# Patient Record
Sex: Male | Born: 1970 | Race: Black or African American | Hispanic: No | Marital: Married | Smoking: Current every day smoker
Health system: Southern US, Community
[De-identification: ages and names within clinical notes are randomized; demographics above are authoritative.]

## PROBLEM LIST (undated history)

## (undated) DIAGNOSIS — R011 Cardiac murmur, unspecified: Secondary | ICD-10-CM

## (undated) DIAGNOSIS — K289 Gastrojejunal ulcer, unspecified as acute or chronic, without hemorrhage or perforation: Secondary | ICD-10-CM

## (undated) DIAGNOSIS — R7303 Prediabetes: Secondary | ICD-10-CM

## (undated) DIAGNOSIS — J45909 Unspecified asthma, uncomplicated: Secondary | ICD-10-CM

## (undated) DIAGNOSIS — M549 Dorsalgia, unspecified: Secondary | ICD-10-CM

## (undated) DIAGNOSIS — D171 Benign lipomatous neoplasm of skin and subcutaneous tissue of trunk: Secondary | ICD-10-CM

## (undated) HISTORY — PX: KNEE ARTHROSCOPY: SHX127

## (undated) HISTORY — DX: Cardiac murmur, unspecified: R01.1

## (undated) HISTORY — DX: Unspecified asthma, uncomplicated: J45.909

---

## 2016-04-21 ENCOUNTER — Encounter (HOSPITAL_COMMUNITY): Payer: Self-pay | Admitting: *Deleted

## 2016-04-21 ENCOUNTER — Emergency Department (HOSPITAL_COMMUNITY)
Admission: EM | Admit: 2016-04-21 | Discharge: 2016-04-21 | Disposition: A | Discharge status: Home / Self Care | Payer: No Typology Code available for payment source | Attending: Emergency Medicine | Admitting: Emergency Medicine

## 2016-04-21 ENCOUNTER — Emergency Department (HOSPITAL_COMMUNITY): Payer: No Typology Code available for payment source

## 2016-04-21 DIAGNOSIS — S39012A Strain of muscle, fascia and tendon of lower back, initial encounter: Secondary | ICD-10-CM | POA: Insufficient documentation

## 2016-04-21 DIAGNOSIS — S161XXA Strain of muscle, fascia and tendon at neck level, initial encounter: Secondary | ICD-10-CM | POA: Insufficient documentation

## 2016-04-21 DIAGNOSIS — S199XXA Unspecified injury of neck, initial encounter: Secondary | ICD-10-CM | POA: Diagnosis present

## 2016-04-21 DIAGNOSIS — Y92410 Unspecified street and highway as the place of occurrence of the external cause: Secondary | ICD-10-CM | POA: Diagnosis not present

## 2016-04-21 DIAGNOSIS — Y939 Activity, unspecified: Secondary | ICD-10-CM | POA: Insufficient documentation

## 2016-04-21 DIAGNOSIS — Y999 Unspecified external cause status: Secondary | ICD-10-CM | POA: Diagnosis not present

## 2016-04-21 DIAGNOSIS — F17200 Nicotine dependence, unspecified, uncomplicated: Secondary | ICD-10-CM | POA: Insufficient documentation

## 2016-04-21 DIAGNOSIS — E119 Type 2 diabetes mellitus without complications: Secondary | ICD-10-CM | POA: Insufficient documentation

## 2016-04-21 MED ORDER — TRAMADOL HCL 50 MG PO TABS: 50.0000 mg | ORAL_TABLET | Freq: Four times a day (QID) | ORAL | 0 refills | Status: DC | PRN

## 2016-04-21 MED ORDER — IBUPROFEN 800 MG PO TABS: 800.0000 mg | ORAL_TABLET | Freq: Three times a day (TID) | ORAL | 0 refills | Status: DC | PRN

## 2016-04-21 NOTE — Discharge Instructions (Signed)
RETURN here as needed. Ice and heat on the areas that are sore.

## 2016-04-21 NOTE — ED Notes (Signed)
Pt is in stable condition d/c and ambulates from ED.
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
## 2016-04-22 ENCOUNTER — Emergency Department (HOSPITAL_COMMUNITY)
Admission: EM | Admit: 2016-04-22 | Discharge: 2016-04-22 | Disposition: A | Discharge status: Home / Self Care | Payer: No Typology Code available for payment source | Attending: Emergency Medicine | Admitting: Emergency Medicine

## 2016-04-22 ENCOUNTER — Encounter (HOSPITAL_COMMUNITY): Payer: Self-pay | Admitting: Emergency Medicine

## 2016-04-22 DIAGNOSIS — F17200 Nicotine dependence, unspecified, uncomplicated: Secondary | ICD-10-CM | POA: Insufficient documentation

## 2016-04-22 DIAGNOSIS — S4992XD Unspecified injury of left shoulder and upper arm, subsequent encounter: Secondary | ICD-10-CM | POA: Diagnosis present

## 2016-04-22 DIAGNOSIS — M25512 Pain in left shoulder: Secondary | ICD-10-CM

## 2016-04-22 DIAGNOSIS — E119 Type 2 diabetes mellitus without complications: Secondary | ICD-10-CM | POA: Diagnosis not present

## 2016-04-22 DIAGNOSIS — M542 Cervicalgia: Secondary | ICD-10-CM | POA: Insufficient documentation

## 2016-04-22 MED ORDER — CYCLOBENZAPRINE HCL 10 MG PO TABS
10.0000 mg | ORAL_TABLET | Freq: Once | ORAL | Status: AC
  Administered 2016-04-22: 10 mg via ORAL
  Filled 2016-04-22: qty 1

## 2016-04-22 MED ORDER — LIDO-CAPSAICIN-MEN-METHYL SAL 0.5-0.035-5-20 % EX PTCH: 1.0000 | MEDICATED_PATCH | Freq: Every day | CUTANEOUS | 0 refills | Status: DC

## 2016-04-22 MED ORDER — CYCLOBENZAPRINE HCL 10 MG PO TABS: 10.0000 mg | ORAL_TABLET | Freq: Two times a day (BID) | ORAL | 0 refills | Status: DC | PRN

## 2016-04-22 MED ORDER — LIDOCAINE 5 % EX PTCH
1.0000 | MEDICATED_PATCH | CUTANEOUS | Status: DC
  Administered 2016-04-22: 1 via TRANSDERMAL
  Filled 2016-04-22: qty 1

## 2016-04-22 NOTE — Discharge Instructions (Signed)
Do not drive while taking the muscle relaxant as it can make you sleepy
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
## 2016-06-21 ENCOUNTER — Institutional Professional Consult (permissible substitution): Payer: Self-pay | Admitting: Sports Medicine

## 2016-06-23 ENCOUNTER — Institutional Professional Consult (permissible substitution): Payer: Self-pay | Admitting: Sports Medicine

## 2016-06-24 ENCOUNTER — Institutional Professional Consult (permissible substitution): Payer: Self-pay | Admitting: Sports Medicine

## 2016-11-27 ENCOUNTER — Encounter (HOSPITAL_COMMUNITY): Payer: Self-pay | Admitting: Emergency Medicine

## 2016-11-27 ENCOUNTER — Emergency Department (HOSPITAL_COMMUNITY)
Admission: EM | Admit: 2016-11-27 | Discharge: 2016-11-27 | Disposition: A | Discharge status: Home / Self Care | Payer: Self-pay | Attending: Emergency Medicine | Admitting: Emergency Medicine

## 2016-11-27 DIAGNOSIS — Y9389 Activity, other specified: Secondary | ICD-10-CM | POA: Insufficient documentation

## 2016-11-27 DIAGNOSIS — F17210 Nicotine dependence, cigarettes, uncomplicated: Secondary | ICD-10-CM | POA: Diagnosis not present

## 2016-11-27 DIAGNOSIS — M545 Low back pain: Secondary | ICD-10-CM | POA: Diagnosis present

## 2016-11-27 DIAGNOSIS — X500XXA Overexertion from strenuous movement or load, initial encounter: Secondary | ICD-10-CM | POA: Insufficient documentation

## 2016-11-27 DIAGNOSIS — Z79899 Other long term (current) drug therapy: Secondary | ICD-10-CM | POA: Insufficient documentation

## 2016-11-27 DIAGNOSIS — Y990 Civilian activity done for income or pay: Secondary | ICD-10-CM | POA: Insufficient documentation

## 2016-11-27 DIAGNOSIS — S39012A Strain of muscle, fascia and tendon of lower back, initial encounter: Secondary | ICD-10-CM | POA: Insufficient documentation

## 2016-11-27 DIAGNOSIS — Y9289 Other specified places as the place of occurrence of the external cause: Secondary | ICD-10-CM | POA: Diagnosis not present

## 2016-11-27 DIAGNOSIS — E119 Type 2 diabetes mellitus without complications: Secondary | ICD-10-CM | POA: Diagnosis not present

## 2016-11-27 MED ORDER — METHOCARBAMOL 500 MG PO TABS: 500.0000 mg | ORAL_TABLET | Freq: Two times a day (BID) | ORAL | 0 refills | Status: DC

## 2016-11-27 MED ORDER — LIDOCAINE 5 % EX PTCH: 1.0000 | MEDICATED_PATCH | CUTANEOUS | 0 refills | Status: DC

## 2016-11-27 MED ORDER — NAPROXEN 500 MG PO TABS: 500.0000 mg | ORAL_TABLET | Freq: Two times a day (BID) | ORAL | 0 refills | Status: DC
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
## 2016-11-27 NOTE — Discharge Instructions (Signed)
Medications: Robaxin, naprosyn, lidocaine patch  Treatment: Take Robaxin twice daily for muscle pain and spasms. Do not drive or operate machinery while taking this medication as it can make you very sleepy. Take naprosyn twice daily as prescribed for your pain. You can alternate with Tylenol as prescribed over-the-counter. You can use the lidocaine patch daily, remove and discard after 12 hours. Use a heating pad 3-4 times daily alternating 20 mintues on, 20 minutes off with ice. Attempt the back exercises and strecthes 1-2 times daily as tolerated.  Follow-up: Please follow up with your primary care provider or the orthopedic doctor outlined below if your symptoms are not improving over the next 7-10 days. Please return to the emergency department if you develop any new or worsening symptoms, including complete numbness of your legs or groin area, loss of bowel or bladder control, inability to walk, or any other new or concerning symptom.

## 2017-01-05 ENCOUNTER — Encounter (HOSPITAL_COMMUNITY): Payer: Self-pay | Admitting: Emergency Medicine

## 2017-01-05 ENCOUNTER — Emergency Department (HOSPITAL_COMMUNITY)
Admission: EM | Admit: 2017-01-05 | Discharge: 2017-01-05 | Disposition: A | Discharge status: Home / Self Care | Payer: Self-pay | Attending: Emergency Medicine | Admitting: Emergency Medicine

## 2017-01-05 ENCOUNTER — Emergency Department (HOSPITAL_COMMUNITY): Payer: Self-pay

## 2017-01-05 DIAGNOSIS — F17200 Nicotine dependence, unspecified, uncomplicated: Secondary | ICD-10-CM | POA: Insufficient documentation

## 2017-01-05 DIAGNOSIS — M545 Low back pain, unspecified: Secondary | ICD-10-CM

## 2017-01-05 DIAGNOSIS — E119 Type 2 diabetes mellitus without complications: Secondary | ICD-10-CM | POA: Insufficient documentation

## 2017-01-05 DIAGNOSIS — Z79899 Other long term (current) drug therapy: Secondary | ICD-10-CM | POA: Insufficient documentation

## 2017-01-05 MED ORDER — OXYCODONE-ACETAMINOPHEN 5-325 MG PO TABS
1.0000 | ORAL_TABLET | Freq: Once | ORAL | Status: AC
  Administered 2017-01-05: 1 via ORAL
  Filled 2017-01-05: qty 1

## 2017-01-05 MED ORDER — DIAZEPAM 5 MG PO TABS: 5.0000 mg | ORAL_TABLET | Freq: Two times a day (BID) | ORAL | 0 refills | Status: DC

## 2017-01-05 MED ORDER — LIDOCAINE 5 % EX PTCH
1.0000 | MEDICATED_PATCH | Freq: Once | CUTANEOUS | Status: DC
  Administered 2017-01-05: 1 via TRANSDERMAL
  Filled 2017-01-05: qty 1

## 2017-01-05 MED ORDER — DICLOFENAC SODIUM 50 MG PO TBEC: 50.0000 mg | DELAYED_RELEASE_TABLET | Freq: Two times a day (BID) | ORAL | 0 refills | Status: DC

## 2017-01-05 MED ORDER — DIAZEPAM 5 MG PO TABS
5.0000 mg | ORAL_TABLET | Freq: Once | ORAL | Status: AC
Start: 2017-01-05 — End: 2017-01-05
  Administered 2017-01-05: 5 mg via ORAL
  Filled 2017-01-05: qty 1
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
## 2017-01-05 NOTE — ED Notes (Signed)
Pt verbalized understanding to not drive while using medications.

## 2017-01-05 NOTE — Discharge Instructions (Signed)
Do not drive while taking the Valium as it can make you sleepy
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
## 2017-04-10 ENCOUNTER — Emergency Department (HOSPITAL_COMMUNITY): Payer: BLUE CROSS/BLUE SHIELD

## 2017-04-10 ENCOUNTER — Encounter (HOSPITAL_COMMUNITY): Payer: Self-pay | Admitting: Emergency Medicine

## 2017-04-10 ENCOUNTER — Emergency Department (HOSPITAL_COMMUNITY)
Admission: EM | Admit: 2017-04-10 | Discharge: 2017-04-10 | Disposition: A | Discharge status: Home / Self Care | Payer: BLUE CROSS/BLUE SHIELD | Attending: Emergency Medicine | Admitting: Emergency Medicine

## 2017-04-10 DIAGNOSIS — Z79899 Other long term (current) drug therapy: Secondary | ICD-10-CM | POA: Insufficient documentation

## 2017-04-10 DIAGNOSIS — R222 Localized swelling, mass and lump, trunk: Secondary | ICD-10-CM | POA: Diagnosis present

## 2017-04-10 DIAGNOSIS — R0789 Other chest pain: Secondary | ICD-10-CM

## 2017-04-10 DIAGNOSIS — F17200 Nicotine dependence, unspecified, uncomplicated: Secondary | ICD-10-CM | POA: Diagnosis not present

## 2017-04-10 DIAGNOSIS — E119 Type 2 diabetes mellitus without complications: Secondary | ICD-10-CM | POA: Diagnosis not present

## 2017-04-10 MED ORDER — DICLOFENAC SODIUM 1 % TD GEL: 2.0000 g | Freq: Four times a day (QID) | TRANSDERMAL | 0 refills | Status: DC

## 2017-04-10 NOTE — ED Triage Notes (Signed)
Pt reports he began to have a swollen area on his R rib area 2 weeks ago. Spot became painful this am. No known injury. No rash or redness noted.

## 2017-04-10 NOTE — Discharge Instructions (Signed)
The x-ray of your right ribs was reassuring.  No abnormality.  Please fill prescription for Voltaren gel and apply to the area that is hurting 4 times a day.  Please return to the emergency department if you have shortness of breath, worsening chest pain or have any new or concerning symptoms.

## 2017-04-10 NOTE — ED Provider Notes (Signed)
[facility] DEPT Provider Note   CSN: [id number] Arrival date & time: 04/10/17  1731     History   Chief Complaint Chief Complaint  Patient presents with  . skin assessment    HPI Daniel Russell is a 46 y.o. male.  HPI  Daniel Russell is a 46yo male with a history of diet-controlled type 2 diabetes who presents the emergency department for evaluation of right lateral chest wall swelling and pain.  He states that he noticed a small circular area of swelling on the right lateral chest wall about 3 weeks ago.  It was nontender, no overlying rash or erythema.  He did not think anything of it until today when it started to become painful while at work.  He states that he now has 6/10 severity constant "burning" pain over area of swelling which is nonradiating. Pain is worsened with lifting the right arm above the head.  He has tried applying heat to the area without significant relief.  He denies associated fever, shortness of breath, cough, nausea/vomiting, diaphoresis, chest pain other than over the nodule.  States that he has never had anything like this before.  Denies recent injury.   Past Medical History:  Diagnosis Date  . Diabetes mellitus without complication ([place])     There are no active problems to display for this patient.   History reviewed. No pertinent surgical history.     Home Medications    Prior to Admission medications   Medication Sig Start Date End Date Taking? Authorizing Provider  cyclobenzaprine (FLEXERIL) 10 MG tablet Take 1 tablet (10 mg total) by mouth 2 (two) times daily as needed for muscle spasms. 04/22/16   Ashley Murrain, NP  diazepam (VALIUM) 5 MG tablet Take 1 tablet (5 mg total) by mouth 2 (two) times daily. 01/05/17   Ashley Murrain, NP  diclofenac (VOLTAREN) 50 MG EC tablet Take 1 tablet (50 mg total) by mouth 2 (two) times daily. 01/05/17   Ashley Murrain, NP  ibuprofen (ADVIL,MOTRIN) 800 MG tablet Take 1 tablet  (800 mg total) by mouth every 8 (eight) hours as needed. 04/21/16   Lawyer, Harrell Gave, PA-C  Lido-Capsaicin-Men-Methyl Sal (MEDI-PATCH-LIDOCAINE) 0.5-0.035-5-20 % PTCH Apply 1 patch topically daily. 04/22/16   Ashley Murrain, NP  lidocaine (LIDODERM) 5 % Place 1 patch onto the skin daily. Remove & Discard patch within 12 hours or as directed by MD 11/27/16   Frederica Kuster, PA-C  methocarbamol (ROBAXIN) 500 MG tablet Take 1 tablet (500 mg total) by mouth 2 (two) times daily. 11/27/16   Law, Bea Graff, PA-C  naproxen (NAPROSYN) 500 MG tablet Take 1 tablet (500 mg total) by mouth 2 (two) times daily. 11/27/16   Law, Bea Graff, PA-C  traMADol (ULTRAM) 50 MG tablet Take 1 tablet (50 mg total) by mouth every 6 (six) hours as needed for severe pain. 04/21/16   Dalia Heading, PA-C    Family History History reviewed. No pertinent family history.  Social History Social History   Tobacco Use  . Smoking status: Current Every Day Smoker  . Smokeless tobacco: Never Used  Substance Use Topics  . Alcohol use: No  . Drug use: No     Allergies   Patient has no known allergies.   Review of Systems Review of Systems  Constitutional: Negative for chills, diaphoresis, fatigue, fever and unexpected weight change.  Respiratory: Negative for cough, shortness of breath and wheezing.   Cardiovascular: Positive for chest pain (  right lateral chest wall pain, over tender nodule.  ).  Gastrointestinal: Negative for abdominal pain, nausea and vomiting.  Musculoskeletal: Negative for back pain and gait problem.  Skin: Negative for color change, rash and wound.     Physical Exam Updated Vital Signs BP 130/87 (BP Location: Left Arm)   Pulse 64   Temp 98.5 F (36.9 C) (Oral)   Resp 18   SpO2 98%   Physical Exam  Constitutional: He appears well-developed and well-nourished. No distress.  HENT:  Head: Normocephalic and atraumatic.  Eyes: Right eye exhibits no discharge. Left eye exhibits no  discharge.  Neck: Normal range of motion. Neck supple.  Cardiovascular: Normal rate, regular rhythm and intact distal pulses. Exam reveals no friction rub.  No murmur heard. Pulmonary/Chest: Effort normal and breath sounds normal. No stridor. No respiratory distress. He has no wheezes. He has no rales.  Approximately 1 cm in diameter area of swelling in the intercostal space on the right lateral chest wall.  It is mildly tender to palpation.  No overlying erythema, warmth or induration.  No fluctuance.  No rash.  Musculoskeletal:  No axillary lymphadenopathy.  Lymphadenopathy:    He has no cervical adenopathy.  Neurological: He is alert. Coordination normal.  Skin: Skin is warm and dry. He is not diaphoretic.  Psychiatric: He has a normal mood and affect. His behavior is normal.  Nursing note and vitals reviewed.    ED Treatments / Results  Labs (all labs ordered are listed, but only abnormal results are displayed) Labs Reviewed - No data to display  EKG  EKG Interpretation None       Radiology No results found.  Procedures Procedures (including critical care time)  Medications Ordered in ED Medications - No data to display   Initial Impression / Assessment and Plan / ED Course  I have reviewed the triage vital signs and the nursing notes.  Pertinent labs & imaging results that were available during my care of the patient were reviewed by me and considered in my medical decision making (see chart for details).    X-ray of right ribs negative for bony lesion or soft tissue swelling.  Suspect that he has inflammation in the intercostal muscles from lifting/straining.  Patient's vital signs are stable and he is in no acute distress.  Will treat with topical Voltaren gel.  Have counseled patient to follow-up with his primary care doctor if symptoms are not improving in a week.  Have also counseled him on return precautions and patient agrees and voiced understanding to the  above plan.  Final Clinical Impressions(s) / ED Diagnoses   Final diagnoses:  Chest wall pain    ED Discharge Orders    None       Bernarda Caffey 04/10/17 2348    Milton Ferguson, MD 04/11/17 [phone]

## 2017-05-12 ENCOUNTER — Ambulatory Visit: Payer: Self-pay | Admitting: Surgery

## 2017-05-12 NOTE — H&P (Signed)
History of Present Illness Daniel Russell. Anthonee Gelin MD; 05/12/2017 10:27 AM) The patient is a 47 year old male who presents with a complaint of Mass. Referred by Dr. Lennette Bihari Via for left chest mass  This is a 47 year old male in good health who discovered a palpable mass on the left side of his chest wall about 1 month ago. This area has become mildly tender and uncomfortable. The patient's job requires a lot of heavy lifting and this becomes irritated when he is at work. There is no erythema or drainage from this area. He was evaluated by his PCP who felt that this likely represented a subcutaneous lipoma. He presents now to discuss removal.   Past Surgical History (Tanisha A. Owens Shark, [place]; 05/12/2017 9:44 AM) Knee Surgery Right.  Diagnostic Studies History (Tanisha A. Owens Shark, [place]; 05/12/2017 9:44 AM) Colonoscopy never  Allergies (Tanisha A. Owens Shark, [place]; 05/12/2017 9:45 AM) No Known Drug Allergies [05/12/2017]: Allergies Reconciled  Medication History (Tanisha A. Owens Shark, [place]; 05/12/2017 9:45 AM) No Current Medications Medications Reconciled  Social History (Tanisha A. Owens Shark, [place]; 05/12/2017 9:44 AM) Alcohol use Remotely quit alcohol use. Caffeine use Carbonated beverages. No drug use Tobacco use Former smoker.  Family History (Tanisha A. Owens Shark, [place]; 05/12/2017 9:44 AM) First Degree Relatives No pertinent family history  Other Problems (Tanisha A. Owens Shark, [place]; 05/12/2017 9:44 AM) Asthma Back Pain     Review of Systems (Tanisha A. Brown RMA; 05/12/2017 9:44 AM) General Not Present- Appetite Loss, Chills, Fatigue, Fever, Night Sweats, Weight Gain and Weight Loss. Skin Not Present- Change in Wart/Mole, Dryness, Hives, Jaundice, New Lesions, Non-Healing Wounds, Rash and Ulcer. HEENT Not Present- Earache, Hearing Loss, Hoarseness, Nose Bleed, Oral Ulcers, Ringing in the Ears, Seasonal Allergies, Sinus Pain, Sore Throat, Visual Disturbances, Wears glasses/contact lenses and Yellow  Eyes. Respiratory Not Present- Bloody sputum, Chronic Cough, Difficulty Breathing, Snoring and Wheezing. Breast Not Present- Breast Mass, Breast Pain, Nipple Discharge and Skin Changes. Cardiovascular Not Present- Chest Pain, Difficulty Breathing Lying Down, Leg Cramps, Palpitations, Rapid Heart Rate, Shortness of Breath and Swelling of Extremities. Gastrointestinal Not Present- Abdominal Pain, Bloating, Bloody Stool, Change in Bowel Habits, Chronic diarrhea, Constipation, Difficulty Swallowing, Excessive gas, Gets full quickly at meals, Hemorrhoids, Indigestion, Nausea, Rectal Pain and Vomiting. Male Genitourinary Present- Frequency. Not Present- Blood in Urine, Change in Urinary Stream, Impotence, Nocturia, Painful Urination, Urgency and Urine Leakage. Musculoskeletal Present- Back Pain. Not Present- Joint Pain, Joint Stiffness, Muscle Pain, Muscle Weakness and Swelling of Extremities.  Vitals (Tanisha A. Brown RMA; 05/12/2017 9:45 AM) 05/12/2017 9:45 AM Weight: 213.8 lb Height: 70in Body Surface Area: 2.15 m Body Mass Index: 30.68 kg/m  Temp.: 98.42F  Pulse: 78 (Regular)  BP: 122/74 (Sitting, Left Arm, Standard)      Physical Exam Rodman Key K. Arik Husmann MD; 05/12/2017 10:28 AM)  The physical exam findings are as follows: Note:WDWN in NAD Eyes: Pupils equal, round; sclera anicteric HENT: Oral mucosa moist; good dentition Neck: No masses palpated, no thyromegaly Lungs: CTA bilaterally; normal respiratory effort Chest - left lateral chest over 6th rib - 3 x 2 cm smooth, firm, mobile subcutaneous mass; no skin changes; directly under a tattoo of his deceased grandmother CV: Regular rate and rhythm; no murmurs; extremities well-perfused with no edema Abd: +bowel sounds, soft, non-tender, no palpable organomegaly; no palpable hernias Skin: Warm, dry; no sign of jaundice; multiple tattoos Psychiatric - alert and oriented x 4; calm mood and affect    Assessment & Plan Rodman Key K.  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 The surgical procedure has been discussed with the patient. Potential risks, benefits, alternative treatments, and expected outcomes have been explained. All of the patient's questions at this time have been answered. The likelihood of reaching the patient's treatment goal is good. The patient understand the proposed surgical procedure and wishes to proceed.  Daniel Russell. Georgette Dover, MD, [facility] Surgery  General/ Trauma Surgery  05/12/2017 10:29 AM

## 2017-06-07 ENCOUNTER — Encounter (HOSPITAL_BASED_OUTPATIENT_CLINIC_OR_DEPARTMENT_OTHER): Payer: Self-pay | Admitting: *Deleted

## 2017-06-07 ENCOUNTER — Other Ambulatory Visit: Payer: Self-pay

## 2017-06-14 ENCOUNTER — Encounter (HOSPITAL_BASED_OUTPATIENT_CLINIC_OR_DEPARTMENT_OTHER): Payer: Self-pay | Admitting: Anesthesiology

## 2017-06-14 ENCOUNTER — Encounter (HOSPITAL_BASED_OUTPATIENT_CLINIC_OR_DEPARTMENT_OTHER)
Admission: RE | Disposition: A | Discharge status: Home / Self Care | Payer: Self-pay | Source: Ambulatory Visit | Attending: Surgery

## 2017-06-14 ENCOUNTER — Ambulatory Visit (HOSPITAL_BASED_OUTPATIENT_CLINIC_OR_DEPARTMENT_OTHER): Payer: BLUE CROSS/BLUE SHIELD | Admitting: Anesthesiology

## 2017-06-14 ENCOUNTER — Ambulatory Visit (HOSPITAL_BASED_OUTPATIENT_CLINIC_OR_DEPARTMENT_OTHER)
Admission: RE | Admit: 2017-06-14 | Discharge: 2017-06-14 | Disposition: A | Discharge status: Home / Self Care | Payer: BLUE CROSS/BLUE SHIELD | Source: Ambulatory Visit | Attending: Surgery | Admitting: Surgery

## 2017-06-14 ENCOUNTER — Other Ambulatory Visit: Payer: Self-pay

## 2017-06-14 DIAGNOSIS — Z9889 Other specified postprocedural states: Secondary | ICD-10-CM | POA: Insufficient documentation

## 2017-06-14 DIAGNOSIS — D171 Benign lipomatous neoplasm of skin and subcutaneous tissue of trunk: Secondary | ICD-10-CM | POA: Insufficient documentation

## 2017-06-14 DIAGNOSIS — Z791 Long term (current) use of non-steroidal anti-inflammatories (NSAID): Secondary | ICD-10-CM | POA: Insufficient documentation

## 2017-06-14 DIAGNOSIS — R222 Localized swelling, mass and lump, trunk: Secondary | ICD-10-CM | POA: Diagnosis not present

## 2017-06-14 DIAGNOSIS — Z87891 Personal history of nicotine dependence: Secondary | ICD-10-CM | POA: Insufficient documentation

## 2017-06-14 HISTORY — DX: Benign lipomatous neoplasm of skin and subcutaneous tissue of trunk: D17.1

## 2017-06-14 HISTORY — PX: LIPOMA EXCISION: SHX5283

## 2017-06-14 HISTORY — DX: Prediabetes: R73.03

## 2017-06-14 HISTORY — DX: Dorsalgia, unspecified: M54.9

## 2017-06-14 SURGERY — EXCISION LIPOMA
Anesthesia: General | Site: Chest | Laterality: Right

## 2017-06-14 MED ORDER — DEXAMETHASONE SODIUM PHOSPHATE 10 MG/ML IJ SOLN
INTRAMUSCULAR | Status: AC
  Filled 2017-06-14: qty 1

## 2017-06-14 MED ORDER — MIDAZOLAM HCL 2 MG/2ML IJ SOLN
INTRAMUSCULAR | Status: AC
  Filled 2017-06-14: qty 2

## 2017-06-14 MED ORDER — SUCCINYLCHOLINE CHLORIDE 20 MG/ML IJ SOLN
INTRAMUSCULAR | Status: DC | PRN
  Administered 2017-06-14: 50 mg via INTRAVENOUS

## 2017-06-14 MED ORDER — PROPOFOL 10 MG/ML IV BOLUS
INTRAVENOUS | Status: AC
  Filled 2017-06-14: qty 20

## 2017-06-14 MED ORDER — BUPIVACAINE-EPINEPHRINE 0.25% -1:200000 IJ SOLN
INTRAMUSCULAR | Status: DC | PRN
  Administered 2017-06-14: 8 mL

## 2017-06-14 MED ORDER — ONDANSETRON HCL 4 MG/2ML IJ SOLN
INTRAMUSCULAR | Status: AC
  Filled 2017-06-14: qty 2

## 2017-06-14 MED ORDER — SCOPOLAMINE 1 MG/3DAYS TD PT72: 1.0000 | MEDICATED_PATCH | Freq: Once | TRANSDERMAL | Status: DC | PRN

## 2017-06-14 MED ORDER — MIDAZOLAM HCL 2 MG/2ML IJ SOLN: 1.0000 mg | INTRAMUSCULAR | Status: DC | PRN

## 2017-06-14 MED ORDER — HYDROCODONE-ACETAMINOPHEN 5-325 MG PO TABS: 1.0000 | ORAL_TABLET | Freq: Four times a day (QID) | ORAL | 0 refills | Status: DC | PRN

## 2017-06-14 MED ORDER — LIDOCAINE 2% (20 MG/ML) 5 ML SYRINGE
INTRAMUSCULAR | Status: AC
  Filled 2017-06-14: qty 5

## 2017-06-14 MED ORDER — LIDOCAINE HCL (CARDIAC) 20 MG/ML IV SOLN
INTRAVENOUS | Status: DC | PRN
  Administered 2017-06-14: 100 mg via INTRAVENOUS

## 2017-06-14 MED ORDER — FENTANYL CITRATE (PF) 100 MCG/2ML IJ SOLN: 50.0000 ug | INTRAMUSCULAR | Status: DC | PRN

## 2017-06-14 MED ORDER — CHLORHEXIDINE GLUCONATE CLOTH 2 % EX PADS: 6.0000 | MEDICATED_PAD | Freq: Once | CUTANEOUS | Status: DC

## 2017-06-14 MED ORDER — CEFAZOLIN SODIUM-DEXTROSE 2-4 GM/100ML-% IV SOLN
2.0000 g | INTRAVENOUS | Status: AC
  Administered 2017-06-14: 2 g via INTRAVENOUS

## 2017-06-14 MED ORDER — LACTATED RINGERS IV SOLN
INTRAVENOUS | Status: DC
  Administered 2017-06-14: 08:00:00 via INTRAVENOUS

## 2017-06-14 MED ORDER — SUGAMMADEX SODIUM 200 MG/2ML IV SOLN
INTRAVENOUS | Status: AC
  Filled 2017-06-14: qty 2

## 2017-06-14 MED ORDER — FENTANYL CITRATE (PF) 100 MCG/2ML IJ SOLN
INTRAMUSCULAR | Status: AC
  Filled 2017-06-14: qty 2

## 2017-06-14 MED ORDER — FENTANYL CITRATE (PF) 100 MCG/2ML IJ SOLN
INTRAMUSCULAR | Status: DC | PRN
  Administered 2017-06-14: 100 ug via INTRAVENOUS

## 2017-06-14 MED ORDER — CEFAZOLIN SODIUM-DEXTROSE 2-4 GM/100ML-% IV SOLN
INTRAVENOUS | Status: AC
  Filled 2017-06-14: qty 100

## 2017-06-14 MED ORDER — MIDAZOLAM HCL 5 MG/5ML IJ SOLN
INTRAMUSCULAR | Status: DC | PRN
  Administered 2017-06-14: 2 mg via INTRAVENOUS

## 2017-06-14 MED ORDER — ONDANSETRON HCL 4 MG/2ML IJ SOLN
INTRAMUSCULAR | Status: DC | PRN
  Administered 2017-06-14: 4 mg via INTRAVENOUS

## 2017-06-14 MED ORDER — DEXAMETHASONE SODIUM PHOSPHATE 4 MG/ML IJ SOLN
INTRAMUSCULAR | Status: DC | PRN
  Administered 2017-06-14: 10 mg via INTRAVENOUS

## 2017-06-14 MED ORDER — PROPOFOL 10 MG/ML IV BOLUS
INTRAVENOUS | Status: DC | PRN
  Administered 2017-06-14 (×2): 200 mg via INTRAVENOUS

## 2017-06-14 SURGICAL SUPPLY — 46 items
BENZOIN TINCTURE PRP APPL 2/3 (GAUZE/BANDAGES/DRESSINGS) ×2 IMPLANT
BLADE CLIPPER SURG (BLADE) IMPLANT
BLADE SURG 15 STRL LF DISP TIS (BLADE) ×1 IMPLANT
BLADE SURG 15 STRL SS (BLADE) ×1
CANISTER SUCT 1200ML W/VALVE (MISCELLANEOUS) IMPLANT
CHLORAPREP W/TINT 26ML (MISCELLANEOUS) ×2 IMPLANT
COVER BACK TABLE 60X90IN (DRAPES) ×2 IMPLANT
COVER MAYO STAND STRL (DRAPES) ×2 IMPLANT
DECANTER SPIKE VIAL GLASS SM (MISCELLANEOUS) IMPLANT
DRAPE LAPAROTOMY 100X72 PEDS (DRAPES) ×2 IMPLANT
DRAPE UTILITY XL STRL (DRAPES) ×2 IMPLANT
DRSG TEGADERM 4X4.75 (GAUZE/BANDAGES/DRESSINGS) IMPLANT
ELECT COATED BLADE 2.86 ST (ELECTRODE) ×2 IMPLANT
ELECT REM PT RETURN 9FT ADLT (ELECTROSURGICAL) ×2
ELECTRODE REM PT RTRN 9FT ADLT (ELECTROSURGICAL) ×1 IMPLANT
GAUZE SPONGE 4X4 12PLY STRL LF (GAUZE/BANDAGES/DRESSINGS) IMPLANT
GLOVE BIO SURGEON STRL SZ 6.5 (GLOVE) ×2 IMPLANT
GLOVE BIO SURGEON STRL SZ7 (GLOVE) ×2 IMPLANT
GLOVE BIOGEL PI IND STRL 7.0 (GLOVE) ×1 IMPLANT
GLOVE BIOGEL PI IND STRL 7.5 (GLOVE) ×2 IMPLANT
GLOVE BIOGEL PI INDICATOR 7.0 (GLOVE) ×1
GLOVE BIOGEL PI INDICATOR 7.5 (GLOVE) ×2
GLOVE SURG SS PI 7.0 STRL IVOR (GLOVE) ×2 IMPLANT
GOWN STRL REUS W/ TWL LRG LVL3 (GOWN DISPOSABLE) ×2 IMPLANT
GOWN STRL REUS W/ TWL XL LVL3 (GOWN DISPOSABLE) ×1 IMPLANT
GOWN STRL REUS W/TWL LRG LVL3 (GOWN DISPOSABLE) ×2
GOWN STRL REUS W/TWL XL LVL3 (GOWN DISPOSABLE) ×1
NEEDLE HYPO 25X1 1.5 SAFETY (NEEDLE) ×2 IMPLANT
NS IRRIG 1000ML POUR BTL (IV SOLUTION) IMPLANT
PACK BASIN DAY SURGERY FS (CUSTOM PROCEDURE TRAY) ×2 IMPLANT
PENCIL BUTTON HOLSTER BLD 10FT (ELECTRODE) ×2 IMPLANT
SLEEVE SCD COMPRESS KNEE MED (MISCELLANEOUS) ×2 IMPLANT
SPONGE GAUZE 2X2 8PLY STRL LF (GAUZE/BANDAGES/DRESSINGS) ×2 IMPLANT
STRIP CLOSURE SKIN 1/2X4 (GAUZE/BANDAGES/DRESSINGS) ×2 IMPLANT
SUT MON AB 4-0 PC3 18 (SUTURE) ×2 IMPLANT
SUT PROLENE 6 0 P 1 18 (SUTURE) IMPLANT
SUT SILK 2 0 PERMA HAND 18 BK (SUTURE) IMPLANT
SUT VIC AB 3-0 SH 27 (SUTURE)
SUT VIC AB 3-0 SH 27X BRD (SUTURE) IMPLANT
SUT VICRYL 3-0 CR8 SH (SUTURE) IMPLANT
SYR BULB 3OZ (MISCELLANEOUS) ×2 IMPLANT
SYR CONTROL 10ML LL (SYRINGE) ×2 IMPLANT
TOWEL OR 17X24 6PK STRL BLUE (TOWEL DISPOSABLE) ×2 IMPLANT
TOWEL OR NON WOVEN STRL DISP B (DISPOSABLE) ×2 IMPLANT
TUBE CONNECTING 20X1/4 (TUBING) IMPLANT
YANKAUER SUCT BULB TIP NO VENT (SUCTIONS) IMPLANT

## 2017-06-14 NOTE — H&P (Addendum)
History of Present Illness  The patient is a 47 year old male who presents with a complaint of Mass. Referred by Dr. Lennette Bihari Via for right chest mass  This is a 47 year old male in good health who discovered a palpable mass on the right side of his chest wall about 1 month ago. This area has become mildly tender and uncomfortable. The patient's job requires a lot of heavy lifting and this becomes irritated when he is at work. There is no erythema or drainage from this area. He was evaluated by his PCP who felt that this likely represented a subcutaneous lipoma. He presents now to discuss removal.   Past Surgical History Knee Surgery Right.  Diagnostic Studies History  Colonoscopy never  Allergies No Known Drug Allergies  Allergies Reconciled  Medication History  No Current Medications Medications Reconciled  Social History  Alcohol use Remotely quit alcohol use. Caffeine use Carbonated beverages. No drug use Tobacco use Former smoker.  Family History  First Degree Relatives No pertinent family history  Other Problems  Asthma Back Pain     Review of Systems  General Not Present- Appetite Loss, Chills, Fatigue, Fever, Night Sweats, Weight Gain and Weight Loss. Skin Not Present- Change in Wart/Mole, Dryness, Hives, Jaundice, New Lesions, Non-Healing Wounds, Rash and Ulcer. HEENT Not Present- Earache, Hearing Loss, Hoarseness, Nose Bleed, Oral Ulcers, Ringing in the Ears, Seasonal Allergies, Sinus Pain, Sore Throat, Visual Disturbances, Wears glasses/contact lenses and Yellow Eyes. Respiratory Not Present- Bloody sputum, Chronic Cough, Difficulty Breathing, Snoring and Wheezing. Breast Not Present- Breast Mass, Breast Pain, Nipple Discharge and Skin Changes. Cardiovascular Not Present- Chest Pain, Difficulty Breathing Lying Down, Leg Cramps, Palpitations, Rapid Heart Rate, Shortness of Breath and Swelling of Extremities. Gastrointestinal Not  Present- Abdominal Pain, Bloating, Bloody Stool, Change in Bowel Habits, Chronic diarrhea, Constipation, Difficulty Swallowing, Excessive gas, Gets full quickly at meals, Hemorrhoids, Indigestion, Nausea, Rectal Pain and Vomiting. Male Genitourinary Present- Frequency. Not Present- Blood in Urine, Change in Urinary Stream, Impotence, Nocturia, Painful Urination, Urgency and Urine Leakage. Musculoskeletal Present- Back Pain. Not Present- Joint Pain, Joint Stiffness, Muscle Pain, Muscle Weakness and Swelling of Extremities.  Vitals  Weight: 213.8 lb Height: 70in Body Surface Area: 2.15 m Body Mass Index: 30.68 kg/m  Temp.: 98.37F  Pulse: 78 (Regular)  BP: 122/74 (Sitting, Left Arm, Standard)      Physical Exam   The physical exam findings are as follows: Note:WDWN in NAD Eyes: Pupils equal, round; sclera anicteric HENT: Oral mucosa moist; good dentition Neck: No masses palpated, no thyromegaly Lungs: CTA bilaterally; normal respiratory effort Chest - right lateral chest over 6th rib - 3 x 2 cm smooth, firm, mobile subcutaneous mass; no skin changes; directly under a tattoo of his deceased grandmother CV: Regular rate and rhythm; no murmurs; extremities well-perfused with no edema Abd: +bowel sounds, soft, non-tender, no palpable organomegaly; no palpable hernias Skin: Warm, dry; no sign of jaundice; multiple tattoos Psychiatric - alert and oriented x 4; calm mood and affect    Assessment & Plan   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 The surgical procedure has been discussed with the patient. Potential risks, benefits, alternative treatments, and expected outcomes have been explained. All of the patient's questions at this time have been answered. The likelihood of reaching the patient's treatment goal is good. The  patient understand the proposed  surgical procedure and wishes to proceed.    Imogene Burn. Georgette Dover, MD, [facility] Surgery  General/ Trauma Surgery  06/14/2017 8:51 AM

## 2017-06-14 NOTE — Anesthesia Procedure Notes (Signed)
Procedure Name: Intubation Date/Time: 06/14/2017 9:17 AM Performed by: Marrianne Mood, CRNA Pre-anesthesia Checklist: Patient identified, Emergency Drugs available, Suction available, Patient being monitored and Timeout performed Patient Re-evaluated:Patient Re-evaluated prior to induction Oxygen Delivery Method: Circle system utilized Preoxygenation: Pre-oxygenation with 100% oxygen Induction Type: IV induction Ventilation: Mask ventilation without difficulty Laryngoscope Size: Miller and 3 Grade View: Grade III Tube type: Oral Tube size: 8.0 mm Number of attempts: 1 Airway Equipment and Method: Stylet and Oral airway Placement Confirmation: ETT inserted through vocal cords under direct vision,  positive ETCO2 and breath sounds checked- equal and bilateral Secured at: 21 cm Tube secured with: Tape Dental Injury: Teeth and Oropharynx as per pre-operative assessment

## 2017-06-14 NOTE — Transfer of Care (Signed)
Immediate Anesthesia Transfer of Care Note  Patient: Daniel Russell  Procedure(s) Performed: EXCISION SUBCUTANEOUS LIPOMA-LEFT CHEST WALL (Right Chest)  Patient Location: PACU  Anesthesia Type:General  Level of Consciousness: awake  Airway & Oxygen Therapy: Patient Spontanous Breathing and Patient connected to face mask oxygen  Post-op Assessment: Report given to RN and Post -op Vital signs reviewed and stable  Post vital signs: Reviewed and stable  Last Vitals:  Vitals:   06/14/17 0758  BP: 127/86  Pulse: (!) 55  Resp: 18  Temp: 36.9 C  SpO2: 100%    Last Pain:  Vitals:   06/14/17 0758  TempSrc: Oral  PainSc: 2       Patients Stated Pain Goal: 0 (16/10/96 0454)  Complications: No apparent anesthesia complications

## 2017-06-14 NOTE — Op Note (Signed)
Preop diagnosis: Subcutaneous mass right chest wall (3 cm) Postop diagnosis: Same Procedure performed: Excision of subcutaneous mass right chest wall (3 cm) Surgeon: Donnie Mesa, MD Anesthesia: General Indications:This is a 47 year old male in good health who discovered a palpable mass on the right side of his chest wall about 1 month ago. This area has become mildly tender and uncomfortable. The patient's job requires a lot of heavy lifting and this becomes irritated when he is at work. There is no erythema or drainage from this area. He was evaluated by his PCP who felt that this likely represented a subcutaneous lipoma.   Description of procedure: The patient is brought to the operating room placed in supine position on operating room table on a beanbag.  After an adequate level of general anesthesia was obtained, he was turned on his left side and the beanbag was secured to help position the patient.  We prepped his right chest wall and draped in sterile fashion.  A timeout was taken to ensure the proper patient and proper procedure.  We infiltrated the area over the mass with 0.25% Marcaine with epinephrine.  I made a 2 cm transverse incision across the mass.  We dissected down into the subcutaneous tissues with cautery.  We dissected down the surface of the lipoma.  We excised the entire lipoma off of the underlying muscle along with some surrounding adipose tissue.  This was sent for pathologic examination.  We inspect for hemostasis.  The wound was closed with 3-0 Vicryl 4-0 Monocryl.  Benzoin and Steri-Strips were applied.  The patient was then extubated and brought to the recovery room in stable condition.  All sponge, instrument, and needle counts are correct.  Imogene Burn. Georgette Dover, MD, [facility]  General/ Trauma Surgery  06/14/2017 9:59 AM

## 2017-06-14 NOTE — Anesthesia Preprocedure Evaluation (Signed)
Anesthesia Evaluation  Patient identified by MRN, date of birth, ID band Patient awake    Reviewed: Allergy & Precautions, NPO status , Patient's Chart, lab work & pertinent test results  Airway Mallampati: II  TM Distance: >3 FB Neck ROM: Full    Dental no notable dental hx.    Pulmonary neg pulmonary ROS, Current Smoker,    Pulmonary exam normal breath sounds clear to auscultation       Cardiovascular negative cardio ROS Normal cardiovascular exam Rhythm:Regular Rate:Normal     Neuro/Psych negative neurological ROS  negative psych ROS   GI/Hepatic negative GI ROS, Neg liver ROS,   Endo/Other  negative endocrine ROS  Renal/GU negative Renal ROS     Musculoskeletal negative musculoskeletal ROS (+)   Abdominal   Peds  Hematology negative hematology ROS (+)   Anesthesia Other Findings   Reproductive/Obstetrics negative OB ROS                             Anesthesia Physical Anesthesia Plan  ASA: II  Anesthesia Plan: General   Post-op Pain Management:    Induction: Intravenous  PONV Risk Score and Plan: 1 and Ondansetron and Dexamethasone  Airway Management Planned: LMA and Oral ETT  Additional Equipment:   Intra-op Plan:   Post-operative Plan: Extubation in OR  Informed Consent: I have reviewed the patients History and Physical, chart, labs and discussed the procedure including the risks, benefits and alternatives for the proposed anesthesia with the patient or authorized representative who has indicated his/her understanding and acceptance.   Dental advisory given  Plan Discussed with: CRNA  Anesthesia Plan Comments:         Anesthesia Quick Evaluation

## 2017-06-14 NOTE — Discharge Instructions (Signed)
°Post Anesthesia Home Care Instructions ° °Activity: °Get plenty of rest for the remainder of the day. A responsible individual must stay with you for 24 hours following the procedure.  °For the next 24 hours, DO NOT: °-Drive a car °-Operate machinery °-Drink alcoholic beverages °-Take any medication unless instructed by your physician °-Make any legal decisions or sign important papers. ° °Meals: °Start with liquid foods such as gelatin or soup. Progress to regular foods as tolerated. Avoid greasy, spicy, heavy foods. If nausea and/or vomiting occur, drink only clear liquids until the nausea and/or vomiting subsides. Call your physician if vomiting continues. ° °Special Instructions/Symptoms: °Your throat may feel dry or sore from the anesthesia or the breathing tube placed in your throat during surgery. If this causes discomfort, gargle with warm salt water. The discomfort should disappear within 24 hours. ° °If you had a scopolamine patch placed behind your ear for the management of post- operative nausea and/or vomiting: ° °1. The medication in the patch is effective for 72 hours, after which it should be removed.  Wrap patch in a tissue and discard in the trash. Wash hands thoroughly with soap and water. °2. You may remove the patch earlier than 72 hours if you experience unpleasant side effects which may include dry mouth, dizziness or visual disturbances. °3. Avoid touching the patch. Wash your hands with soap and water after contact with the patch. °   ° ° ° °[facility] °Office Phone Number [phone] ° °Lipoma Excision: POST OP INSTRUCTIONS ° °Always review your discharge instruction sheet given to you by the facility where your surgery was performed. ° °IF YOU HAVE DISABILITY OR FAMILY LEAVE FORMS, YOU MUST BRING THEM TO THE OFFICE FOR PROCESSING.  DO NOT GIVE THEM TO YOUR DOCTOR. ° °1. A prescription for pain medication may be given to you upon discharge.  Take your pain medication as  prescribed, if needed.  If narcotic pain medicine is not needed, then you may take acetaminophen (Tylenol) or ibuprofen (Advil) as needed. °2. Take your usually prescribed medications unless otherwise directed °3. If you need a refill on your pain medication, please contact your pharmacy.  They will contact our office to request authorization.  Prescriptions will not be filled after 5pm or on week-ends. °4. You should eat very light the first 24 hours after surgery, such as soup, crackers, pudding, etc.  Resume your normal diet the day after surgery. °5. Most patients will experience some swelling and bruising around the surgical site.  Ice packs will help.  Swelling and bruising can take several days to resolve.  °6. It is common to experience some constipation if taking pain medication after surgery.  Increasing fluid intake and taking a stool softener will usually help or prevent this problem from occurring.  A mild laxative (Milk of Magnesia or Miralax) should be taken according to package directions if there are no bowel movements after 48 hours. °7. You may remove your bandages 48 hours after surgery, and you may shower at that time.  You will have steri-strips (small skin tapes) in place directly over the incision.  These strips should be left on the skin for 7-10 days.   °8. ACTIVITIES:  You may resume regular daily activities (gradually increasing) beginning the next day.   You may have sexual intercourse when it is comfortable. °a. You may drive when you no longer are taking prescription pain medication, you can comfortably wear a seatbelt, and you can safely maneuver your   car and apply brakes. °b. RETURN TO WORK:  1-2 weeks °9. You should see your doctor in the office for a follow-up appointment approximately two to three weeks after your surgery.   ° °WHEN TO CALL YOUR DOCTOR: °1. Fever over 101.0 °2. Nausea and/or vomiting. °3. Extreme swelling or bruising. °4. Continued bleeding from  incision. °5. Increased pain, redness, or drainage from the incision. ° °The clinic staff is available to answer your questions during regular business hours.  Please don’t hesitate to call and ask to speak to one of the nurses for clinical concerns.  If you have a medical emergency, go to the nearest emergency room or call 911.  A surgeon from [facility] is always on call at the hospital. ° °For further questions, please visit [email or web address]  ° ° °

## 2017-06-15 ENCOUNTER — Encounter (HOSPITAL_BASED_OUTPATIENT_CLINIC_OR_DEPARTMENT_OTHER): Payer: Self-pay | Admitting: Surgery

## 2017-06-15 NOTE — Anesthesia Postprocedure Evaluation (Signed)
Anesthesia Post Note  Patient: Daniel Russell  Procedure(s) Performed: EXCISION SUBCUTANEOUS LIPOMA-LEFT CHEST WALL (Right Chest)     Patient location during evaluation: PACU Anesthesia Type: General Level of consciousness: sedated and patient cooperative Pain management: pain level controlled Vital Signs Assessment: post-procedure vital signs reviewed and stable Respiratory status: spontaneous breathing Cardiovascular status: stable Anesthetic complications: no    Last Vitals:  Vitals:   06/14/17 1100 06/14/17 1115  BP: (!) 133/93 127/86  Pulse: 72 82  Resp: 14 16  Temp:  36.5 C  SpO2: 100% 100%    Last Pain:  Vitals:   06/14/17 1115  TempSrc:   PainSc: 0-No pain                 Nolon Nations

## 2017-06-16 ENCOUNTER — Other Ambulatory Visit: Payer: Self-pay

## 2017-06-16 ENCOUNTER — Emergency Department (HOSPITAL_BASED_OUTPATIENT_CLINIC_OR_DEPARTMENT_OTHER)
Admission: EM | Admit: 2017-06-16 | Discharge: 2017-06-16 | Disposition: A | Discharge status: Home / Self Care | Payer: BLUE CROSS/BLUE SHIELD | Attending: Emergency Medicine | Admitting: Emergency Medicine

## 2017-06-16 ENCOUNTER — Encounter (HOSPITAL_BASED_OUTPATIENT_CLINIC_OR_DEPARTMENT_OTHER): Payer: Self-pay | Admitting: *Deleted

## 2017-06-16 DIAGNOSIS — Z5321 Procedure and treatment not carried out due to patient leaving prior to being seen by health care provider: Secondary | ICD-10-CM | POA: Diagnosis not present

## 2017-06-16 DIAGNOSIS — G8918 Other acute postprocedural pain: Secondary | ICD-10-CM | POA: Diagnosis present

## 2017-06-16 NOTE — ED Triage Notes (Signed)
States he had OP surgery to remove a cyst on his right ribs. He called his MD after having pain was told to take Motrin. Here for pain control.

## 2017-06-16 NOTE — ED Notes (Signed)
States he may have to leave and come back tomorrow due to wife has to go to work.

## 2017-06-23 DIAGNOSIS — Z202 Contact with and (suspected) exposure to infections with a predominantly sexual mode of transmission: Secondary | ICD-10-CM | POA: Diagnosis not present

## 2017-06-30 DIAGNOSIS — Z3141 Encounter for fertility testing: Secondary | ICD-10-CM | POA: Diagnosis not present

## 2017-06-30 DIAGNOSIS — Z113 Encounter for screening for infections with a predominantly sexual mode of transmission: Secondary | ICD-10-CM | POA: Diagnosis not present

## 2017-07-18 DIAGNOSIS — Z31440 Encounter of male for testing for genetic disease carrier status for procreative management: Secondary | ICD-10-CM | POA: Diagnosis not present

## 2017-08-20 DIAGNOSIS — R109 Unspecified abdominal pain: Secondary | ICD-10-CM | POA: Diagnosis not present

## 2017-08-20 DIAGNOSIS — F17210 Nicotine dependence, cigarettes, uncomplicated: Secondary | ICD-10-CM | POA: Diagnosis not present

## 2017-08-20 DIAGNOSIS — R7303 Prediabetes: Secondary | ICD-10-CM | POA: Insufficient documentation

## 2017-08-20 DIAGNOSIS — R110 Nausea: Secondary | ICD-10-CM | POA: Insufficient documentation

## 2017-08-20 DIAGNOSIS — R1013 Epigastric pain: Secondary | ICD-10-CM | POA: Insufficient documentation

## 2017-08-20 DIAGNOSIS — R1011 Right upper quadrant pain: Secondary | ICD-10-CM | POA: Diagnosis not present

## 2017-08-20 DIAGNOSIS — R1012 Left upper quadrant pain: Secondary | ICD-10-CM | POA: Diagnosis not present

## 2017-08-21 ENCOUNTER — Other Ambulatory Visit: Payer: Self-pay

## 2017-08-21 ENCOUNTER — Emergency Department (HOSPITAL_COMMUNITY)
Admission: EM | Admit: 2017-08-21 | Discharge: 2017-08-21 | Disposition: A | Discharge status: Home / Self Care | Payer: BLUE CROSS/BLUE SHIELD | Attending: Emergency Medicine | Admitting: Emergency Medicine

## 2017-08-21 ENCOUNTER — Emergency Department (HOSPITAL_COMMUNITY): Payer: BLUE CROSS/BLUE SHIELD

## 2017-08-21 ENCOUNTER — Encounter (HOSPITAL_COMMUNITY): Payer: Self-pay | Admitting: Emergency Medicine

## 2017-08-21 DIAGNOSIS — R109 Unspecified abdominal pain: Secondary | ICD-10-CM | POA: Diagnosis not present

## 2017-08-21 DIAGNOSIS — R1010 Upper abdominal pain, unspecified: Secondary | ICD-10-CM

## 2017-08-21 LAB — CBC
HCT: 44 % (ref 39.0–52.0)
Hemoglobin: 14.8 g/dL (ref 13.0–17.0)
MCH: 32.2 pg (ref 26.0–34.0)
MCHC: 33.6 g/dL (ref 30.0–36.0)
MCV: 95.7 fL (ref 78.0–100.0)
PLATELETS: 223 10*3/uL (ref 150–400)
RBC: 4.6 MIL/uL (ref 4.22–5.81)
RDW: 14.3 % (ref 11.5–15.5)
WBC: 10 10*3/uL (ref 4.0–10.5)

## 2017-08-21 LAB — URINALYSIS, ROUTINE W REFLEX MICROSCOPIC
Bacteria, UA: NONE SEEN
Bilirubin Urine: NEGATIVE
GLUCOSE, UA: NEGATIVE mg/dL
Hgb urine dipstick: NEGATIVE
Ketones, ur: NEGATIVE mg/dL
NITRITE: NEGATIVE
Protein, ur: NEGATIVE mg/dL
SPECIFIC GRAVITY, URINE: 1.019 (ref 1.005–1.030)
pH: 7 (ref 5.0–8.0)

## 2017-08-21 LAB — COMPREHENSIVE METABOLIC PANEL
ALT: 19 U/L (ref 17–63)
AST: 20 U/L (ref 15–41)
Albumin: 3.9 g/dL (ref 3.5–5.0)
Alkaline Phosphatase: 83 U/L (ref 38–126)
Anion gap: 8 (ref 5–15)
BILIRUBIN TOTAL: 0.6 mg/dL (ref 0.3–1.2)
BUN: 16 mg/dL (ref 6–20)
CALCIUM: 9.1 mg/dL (ref 8.9–10.3)
CO2: 25 mmol/L (ref 22–32)
CREATININE: 1 mg/dL (ref 0.61–1.24)
Chloride: 106 mmol/L (ref 101–111)
GFR calc Af Amer: >60 mL/min (ref >60)
Glucose, Bld: 96 mg/dL (ref 65–99)
Potassium: 3.7 mmol/L (ref 3.5–5.1)
Sodium: 139 mmol/L (ref 135–145)
TOTAL PROTEIN: 6.9 g/dL (ref 6.5–8.1)

## 2017-08-21 LAB — LIPASE, BLOOD: Lipase: 19 U/L (ref 11–51)

## 2017-08-21 NOTE — ED Triage Notes (Signed)
Patient is complaining of mid abdominal pain that started on Wednesday. Patient states bowel movement pattern has changed to a few times a day to every other day. Patient states that his stomach has been hurting and also burning.

## 2017-08-21 NOTE — ED Notes (Signed)
Patient given sprite to drink. 

## 2017-08-21 NOTE — Discharge Instructions (Signed)
Abdominal (belly) pain can be caused by many things. Your caregiver performed an examination and possibly ordered blood/urine tests and imaging (CT scan, x-rays, ultrasound). Many cases can be observed and treated at home after initial evaluation in the emergency department. Even though you are being discharged home, abdominal pain can be unpredictable. Therefore, you need a repeated exam if your pain does not resolve, returns, or worsens. Most patients with abdominal pain don't have to be admitted to the hospital or have surgery, but serious problems like appendicitis and gallbladder attacks can start out as nonspecific pain. Many abdominal conditions cannot be diagnosed in one visit, so follow-up evaluations are very important. SEEK IMMEDIATE MEDICAL ATTENTION IF: The pain does not go away or becomes severe.  A temperature above 101 develops.  Repeated vomiting occurs (multiple episodes).  The pain becomes localized to portions of the abdomen. The right side could possibly be appendicitis. In an adult, the left lower portion of the abdomen could be colitis or diverticulitis.  Blood is being passed in stools or vomit (bright red or black tarry stools).  Return also if you develop chest pain, difficulty breathing, dizziness or fainting, or become confused, poorly responsive, or inconsolable (young children).  MiraLAX is available over-the-counter and can be used as follows to help soften your bowel movements: -For remainder of constipation clean out, take 2 capfuls of Miralax by mouth mixed with 16 ounces of juice or gatorade once. -After constipation clean out, take 1 capful of Miralax by mouth daily mixed with 16 ounces of juice or gatrorade -If you experience diarrhea, you may decreased the daily dose by 1/2 capful as needed.

## 2017-08-21 NOTE — ED Provider Notes (Signed)
[facility] DEPT Provider Note   CSN: [id number] Arrival date & time: 08/20/17  2349     History   Chief Complaint Chief Complaint  Patient presents with  . Abdominal Pain    HPI Daniel Russell is a 47 y.o. male who presents to the emergency department with a chief complaint of abdominal pain.  The patient reports severe epigastric abdominal pain that has been coming and going over the last 4 days.  He states "it feels as if someone has shoving 1 million hypodermic needles into my abdomen."The pain will last approximately 1 minute before resolving spontaneously.  Nonradiating.  He is unable to state any factors that bring the pain on or help it subside as well as any aggravating or alleviating factors.  She reports nausea 4 days ago, which is since resolved.  He denies fever, chills, vomiting, diarrhea, dysuria, penile or testicular pain or swelling, back pain, chest pain, dyspnea, rash, or HA.   States that his bowel movements over the last few days have been small and hard. Last BM this afternoon.   No chronic past medical history.  No daily medication.  He reports infrequent use of NSAIDs.  Minimal alcohol use and no recent alcohol use.  No known sick contacts.  The history is provided by the patient. No language interpreter was used.  Abdominal Pain   Associated symptoms include nausea (resolved). Pertinent negatives include fever, diarrhea, vomiting, dysuria, headaches and myalgias.    Past Medical History:  Diagnosis Date  . Back pain   . Lipoma of chest wall    left chest  . Pre-diabetes     There are no active problems to display for this patient.   Past Surgical History:  Procedure Laterality Date  . KNEE ARTHROSCOPY    . LIPOMA EXCISION Right 06/14/2017   Procedure: EXCISION SUBCUTANEOUS LIPOMA-LEFT CHEST WALL;  Surgeon: Donnie Mesa, MD;  Location: [facility];  Service: General;  Laterality: Right;         Home Medications    Prior to Admission medications   Medication Sig Start Date End Date Taking? Authorizing Provider  diclofenac sodium (VOLTAREN) 1 % GEL Apply 2 g topically 4 (four) times daily. Patient not taking: Reported on 08/21/2017 04/10/17   Glyn Ade, PA-C  HYDROcodone-acetaminophen (NORCO/VICODIN) 5-325 MG tablet Take 1 tablet by mouth every 6 (six) hours as needed for moderate pain. Patient not taking: Reported on 08/21/2017 06/14/17   Donnie Mesa, MD    Family History History reviewed. No pertinent family history.  Social History Social History   Tobacco Use  . Smoking status: Current Every Day Smoker    Packs/day: 0.50  . Smokeless tobacco: Never Used  Substance Use Topics  . Alcohol use: No  . Drug use: No     Allergies   Patient has no known allergies.   Review of Systems Review of Systems  Constitutional: Negative for appetite change and fever.  Eyes: Negative for visual disturbance.  Respiratory: Negative for shortness of breath.   Cardiovascular: Negative for chest pain.  Gastrointestinal: Positive for abdominal pain and nausea (resolved). Negative for abdominal distention, diarrhea and vomiting.  Genitourinary: Negative for dysuria, penile pain, penile swelling, scrotal swelling and testicular pain.  Musculoskeletal: Negative for back pain, myalgias and neck pain.  Skin: Negative for rash.  Allergic/Immunologic: Negative for immunocompromised state.  Neurological: Negative for weakness and headaches.  Psychiatric/Behavioral: Negative for confusion.     Physical Exam Updated Vital  Signs BP 121/78 (BP Location: Right Arm)   Pulse (!) 59   Temp 98.5 F (36.9 C) (Oral)   Resp 17   Ht 5\' 10"  (1.778 m)   Wt 93.4 kg (206 lb)   SpO2 96%   BMI 29.56 kg/m   Physical Exam  Constitutional: He appears well-developed and well-nourished. No distress.  HENT:  Head: Normocephalic.  Eyes: Conjunctivae are normal.  Neck: Normal  range of motion. Neck supple.  Cardiovascular: Normal rate, regular rhythm, normal heart sounds and intact distal pulses. Exam reveals no gallop and no friction rub.  No murmur heard. Pulmonary/Chest: Effort normal and breath sounds normal. No stridor. No respiratory distress. He has no wheezes. He has no rales. He exhibits no tenderness.  Abdominal: Soft. He exhibits no distension and no mass. There is tenderness. There is no rebound and no guarding. No hernia.  Normoactive bowel sounds.  He is moderately tender to palpation in the epigastric and bilateral upper quadrants.  Abdomen is soft, nondistended.  No peritoneal signs.  No CVA tenderness bilaterally.  Negative Murphy sign.  Musculoskeletal: Normal range of motion. He exhibits no edema, tenderness or deformity.  Neurological: He is alert.  Skin: Skin is warm and dry. Capillary refill takes less than 2 seconds.  Psychiatric: His behavior is normal.  Nursing note and vitals reviewed.    ED Treatments / Results  Labs (all labs ordered are listed, but only abnormal results are displayed) Labs Reviewed  URINALYSIS, ROUTINE W REFLEX MICROSCOPIC - Abnormal; Notable for the following components:      Result Value   APPearance CLOUDY (*)    Leukocytes, UA TRACE (*)    Squamous Epithelial / LPF 0-5 (*)    All other components within normal limits  LIPASE, BLOOD  COMPREHENSIVE METABOLIC PANEL  CBC    EKG None  Radiology Dg Abdomen Acute W/chest  Result Date: 08/21/2017 CLINICAL DATA:  Abdominal pain for 3 days. EXAM: DG ABDOMEN ACUTE W/ 1V CHEST COMPARISON:  Chest radiograph 04/10/2017 FINDINGS: The cardiomediastinal contours are normal. The lungs are clear. There is no free intra-abdominal air. No dilated bowel loops to suggest obstruction. Small volume of stool throughout the colon. No radiopaque calculi. No acute osseous abnormalities are seen. IMPRESSION: Negative abdominal radiographs.  No acute cardiopulmonary disease.  Electronically Signed   By: Jeb Levering M.D.   On: 08/21/2017 02:47    Procedures Procedures (including critical care time)  Medications Ordered in ED Medications - No data to display   Initial Impression / Assessment and Plan / ED Course  I have reviewed the triage vital signs and the nursing notes.  Pertinent labs & imaging results that were available during my care of the patient were reviewed by me and considered in my medical decision making (see chart for details).     Patient is nontoxic, nonseptic appearing, in no apparent distress.  Patient's pain and other symptoms adequately managed in emergency department.  Fluid bolus given.  Labs, imaging and vitals reviewed.  Patient does not meet the SIRS or Sepsis criteria.  On repeat exam patient does not have a surgical abdomen and there are no peritoneal signs.  No indication of appendicitis, bowel obstruction, bowel perforation, cholecystitis, or diverticulitis.  Patient discharged home with symptomatic treatment for constipation and given strict instructions for follow-up with their primary care physician.  I have also discussed reasons to return immediately to the ER.  Patient expresses understanding and agrees with plan.  Final Clinical Impressions(s) /  ED Diagnoses   Final diagnoses:  Pain of upper abdomen    ED Discharge Orders    None       Joanne Gavel, PA-C 08/21/17 0404    Merryl Hacker, MD 08/21/17 2304

## 2017-12-27 ENCOUNTER — Emergency Department (HOSPITAL_COMMUNITY): Payer: BLUE CROSS/BLUE SHIELD

## 2017-12-27 ENCOUNTER — Emergency Department (HOSPITAL_COMMUNITY)
Admission: EM | Admit: 2017-12-27 | Discharge: 2017-12-27 | Disposition: A | Discharge status: Home / Self Care | Payer: BLUE CROSS/BLUE SHIELD | Attending: Emergency Medicine | Admitting: Emergency Medicine

## 2017-12-27 ENCOUNTER — Encounter (HOSPITAL_COMMUNITY): Payer: Self-pay | Admitting: *Deleted

## 2017-12-27 ENCOUNTER — Other Ambulatory Visit: Payer: Self-pay

## 2017-12-27 DIAGNOSIS — R059 Cough, unspecified: Secondary | ICD-10-CM

## 2017-12-27 DIAGNOSIS — F17210 Nicotine dependence, cigarettes, uncomplicated: Secondary | ICD-10-CM | POA: Diagnosis not present

## 2017-12-27 DIAGNOSIS — R05 Cough: Secondary | ICD-10-CM | POA: Insufficient documentation

## 2017-12-27 DIAGNOSIS — Z79899 Other long term (current) drug therapy: Secondary | ICD-10-CM | POA: Insufficient documentation

## 2017-12-27 DIAGNOSIS — R0789 Other chest pain: Secondary | ICD-10-CM | POA: Diagnosis not present

## 2017-12-27 DIAGNOSIS — R079 Chest pain, unspecified: Secondary | ICD-10-CM | POA: Diagnosis not present

## 2017-12-27 MED ORDER — ALBUTEROL SULFATE HFA 108 (90 BASE) MCG/ACT IN AERS: 1.0000 | INHALATION_SPRAY | Freq: Four times a day (QID) | RESPIRATORY_TRACT | 0 refills | Status: AC | PRN

## 2017-12-27 MED ORDER — BENZONATATE 100 MG PO CAPS: 100.0000 mg | ORAL_CAPSULE | Freq: Three times a day (TID) | ORAL | 0 refills | Status: DC

## 2017-12-27 MED ORDER — FLUTICASONE PROPIONATE 50 MCG/ACT NA SUSP: 1.0000 | Freq: Every day | NASAL | 0 refills | Status: DC

## 2017-12-27 NOTE — ED Triage Notes (Signed)
Cough x1 week with yellow sputum, denies fevers. No meds PTA. Pain in his chest when he coughs.

## 2017-12-27 NOTE — Discharge Instructions (Addendum)
You were seen in the ER for cough. Your x-ray was normal- no pneumonia.   We suspect this is viral vs. Allergies. We are treating this with supportive measures:  - Inhaler- use 1-2 puffs every 6 hours as needed for wheezing or shortness of breath - Tessalon- this is a cough medicine, use every 8 hours as needed for cough - Flonase- this is a nasal spray for congestion, use in each nostril daily.   Take tylenol/motrin per over the counter dosing for pain  We have prescribed you new medication(s) today. Discuss the medications prescribed today with your pharmacist as they can have adverse effects and interactions with your other medicines including over the counter and prescribed medications. Seek medical evaluation if you start to experience new or abnormal symptoms after taking one of these medicines, seek care immediately if you start to experience difficulty breathing, feeling of your throat closing, facial swelling, or rash as these could be indications of a more serious allergic reaction  Follow up with your primary care within 1 week. Return to the ER for new or worsening symptoms or any other concerns.

## 2017-12-27 NOTE — ED Provider Notes (Signed)
[facility] DEPT Provider Note   CSN: [id number] Arrival date & time: 12/27/17  9326     History   Chief Complaint Chief Complaint  Patient presents with  . Cough    HPI Daniel Russell is a 47 y.o. male with a hx of tobacco abuse, and pre-diabetes who presents to the ED with complaints of cough for the past 1 week. Patient states cough is productive with yellow/green to clear mucous sputum, worse in the AM. Reports associated anterior chest wall pain with coughing, otherwise no chest pain. His wife at bedside is concerned he has been wheezing at night, patient not aware of this. No interventions PTA. Denies fever, dyspnea, congestion, ear pain, sore throat, or hemoptysis. Reports hx of similar with dx of bronchitis.   HPI  Past Medical History:  Diagnosis Date  . Back pain   . Lipoma of chest wall    left chest  . Pre-diabetes     There are no active problems to display for this patient.   Past Surgical History:  Procedure Laterality Date  . KNEE ARTHROSCOPY    . LIPOMA EXCISION Right 06/14/2017   Procedure: EXCISION SUBCUTANEOUS LIPOMA-LEFT CHEST WALL;  Surgeon: Donnie Mesa, MD;  Location: [facility];  Service: General;  Laterality: Right;        Home Medications    Prior to Admission medications   Medication Sig Start Date End Date Taking? Authorizing Provider  diclofenac sodium (VOLTAREN) 1 % GEL Apply 2 g topically 4 (four) times daily. Patient not taking: Reported on 08/21/2017 04/10/17   Glyn Ade, PA-C  HYDROcodone-acetaminophen (NORCO/VICODIN) 5-325 MG tablet Take 1 tablet by mouth every 6 (six) hours as needed for moderate pain. Patient not taking: Reported on 08/21/2017 06/14/17   Donnie Mesa, MD    Family History No family history on file.  Social History Social History   Tobacco Use  . Smoking status: Current Every Day Smoker    Packs/day: 0.50  . Smokeless tobacco: Never Used  Substance  Use Topics  . Alcohol use: No  . Drug use: No     Allergies   Patient has no known allergies.   Review of Systems Review of Systems  Constitutional: Negative for chills and fever.  HENT: Negative for congestion, ear pain and sore throat.   Respiratory: Positive for cough and wheezing (per wife at bedside). Negative for shortness of breath.   Cardiovascular: Positive for chest pain (w/ coughing).  Gastrointestinal: Negative for abdominal pain, nausea and vomiting.   Physical Exam Updated Vital Signs BP 120/77   Pulse 62   Temp 97.7 F (36.5 C)   Resp 16   Ht 5\' 10"  (1.778 m)   Wt 93 kg   SpO2 98%   BMI 29.41 kg/m   Physical Exam  Constitutional: He appears well-developed and well-nourished.  Non-toxic appearance. No distress.  HENT:  Head: Normocephalic and atraumatic.  Right Ear: Tympanic membrane normal.  Left Ear: Tympanic membrane normal.  Nose: Nose normal.  Mouth/Throat: Uvula is midline and oropharynx is clear and moist. No oropharyngeal exudate, posterior oropharyngeal edema or posterior oropharyngeal erythema.  Eyes: Conjunctivae are normal. Right eye exhibits no discharge. Left eye exhibits no discharge.  Neck: Normal range of motion. Neck supple. No neck rigidity.  Cardiovascular: Normal rate and regular rhythm.  No murmur heard. Pulmonary/Chest: Effort normal and breath sounds normal. No stridor. No respiratory distress. He has no wheezes. He has no rales. He exhibits tenderness (  anterior chest wall without crepitus or overlying deformity).  Abdominal: Soft. He exhibits no distension.  Lymphadenopathy:    He has no cervical adenopathy.  Neurological: He is alert.  Clear speech.   Psychiatric: He has a normal mood and affect. His behavior is normal. Thought content normal.  Nursing note and vitals reviewed.    ED Treatments / Results  Labs (all labs ordered are listed, but only abnormal results are displayed) Labs Reviewed - No data to  display  EKG None  Radiology Dg Chest 2 View  Result Date: 12/27/2017 CLINICAL DATA:  Left side chest pain, cough EXAM: CHEST - 2 VIEW COMPARISON:  08/21/2017 FINDINGS: Heart and mediastinal contours are within normal limits. No focal opacities or effusions. No acute bony abnormality. IMPRESSION: No active cardiopulmonary disease. Electronically Signed   By: Rolm Baptise M.D.   On: 12/27/2017 07:15    Procedures Procedures (including critical care time)  Medications Ordered in ED Medications - No data to display   Initial Impression / Assessment and Plan / ED Course  I have reviewed the triage vital signs and the nursing notes.  Pertinent labs & imaging results that were available during my care of the patient were reviewed by me and considered in my medical decision making (see chart for details).   Patient presents with complaint of cough. Patient nontoxic appearing, in no apparent distress, vitals without significant abnormality. CXR negative for infiltrate, effusion, pneumothorax, or underlying bony abnormality. Lungs CTA, no evidence of respiratory distress. Suspect viral/allergic. Patient's wife reports wheezing, none auscultated on exam, but feel that tx with inhaler would be appropriate. Flonase and Tessalon given as additional supportive measures. I discussed results, treatment plan, need for PCP follow-up, and return precautions with the patient. Provided opportunity for questions, patient confirmed understanding and is in agreement with plan.    Final Clinical Impressions(s) / ED Diagnoses   Final diagnoses:  Cough    ED Discharge Orders         Ordered    albuterol (PROVENTIL HFA;VENTOLIN HFA) 108 (90 Base) MCG/ACT inhaler  Every 6 hours PRN     12/27/17 0737    benzonatate (TESSALON) 100 MG capsule  Every 8 hours     12/27/17 0737    fluticasone (FLONASE) 50 MCG/ACT nasal spray  Daily     12/27/17 0737           Amaryllis Dyke, PA-C 12/27/17 0815     Merryl Hacker, MD 12/28/17 [phone]

## 2018-04-12 ENCOUNTER — Encounter: Payer: Self-pay | Admitting: Gastroenterology

## 2018-04-12 ENCOUNTER — Ambulatory Visit (INDEPENDENT_AMBULATORY_CARE_PROVIDER_SITE_OTHER)
Admission: RE | Admit: 2018-04-12 | Discharge: 2018-04-12 | Disposition: A | Discharge status: Home / Self Care | Payer: BLUE CROSS/BLUE SHIELD | Source: Ambulatory Visit | Attending: Gastroenterology | Admitting: Gastroenterology

## 2018-04-12 ENCOUNTER — Ambulatory Visit (INDEPENDENT_AMBULATORY_CARE_PROVIDER_SITE_OTHER): Payer: BLUE CROSS/BLUE SHIELD | Admitting: Gastroenterology

## 2018-04-12 ENCOUNTER — Other Ambulatory Visit (INDEPENDENT_AMBULATORY_CARE_PROVIDER_SITE_OTHER): Payer: BLUE CROSS/BLUE SHIELD

## 2018-04-12 VITALS — BP 120/80 | HR 68 | Ht 70.0 in | Wt 211.0 lb

## 2018-04-12 DIAGNOSIS — K5900 Constipation, unspecified: Secondary | ICD-10-CM

## 2018-04-12 DIAGNOSIS — R1013 Epigastric pain: Secondary | ICD-10-CM

## 2018-04-12 DIAGNOSIS — R195 Other fecal abnormalities: Secondary | ICD-10-CM | POA: Diagnosis not present

## 2018-04-12 DIAGNOSIS — G8929 Other chronic pain: Secondary | ICD-10-CM

## 2018-04-12 DIAGNOSIS — R194 Change in bowel habit: Secondary | ICD-10-CM

## 2018-04-12 LAB — URINALYSIS
BILIRUBIN URINE: NEGATIVE
HGB URINE DIPSTICK: NEGATIVE
KETONES UR: NEGATIVE
Leukocytes, UA: NEGATIVE
Nitrite: NEGATIVE
PH: 6 (ref 5.0–8.0)
Specific Gravity, Urine: 1.025 (ref 1.000–1.030)
Total Protein, Urine: NEGATIVE
UROBILINOGEN UA: 0.2 (ref 0.0–1.0)
Urine Glucose: NEGATIVE

## 2018-04-12 LAB — LIPASE

## 2018-04-12 LAB — CBC
HEMATOCRIT: 45.3 % (ref 39.0–52.0)
HEMOGLOBIN: 15.2 g/dL (ref 13.0–17.0)
MCHC: 33.6 g/dL (ref 30.0–36.0)
MCV: 93.8 fl (ref 78.0–100.0)
Platelets: 195 10*3/uL (ref 150.0–400.0)
RBC: 4.83 Mil/uL (ref 4.22–5.81)
RDW: 13.7 % (ref 11.5–15.5)
WBC: 11.5 10*3/uL — AB (ref 4.0–10.5)

## 2018-04-12 LAB — AMYLASE: AMYLASE: 49 U/L (ref 27–131)

## 2018-04-12 LAB — HEPATIC FUNCTION PANEL
ALBUMIN: 4.2 g/dL (ref 3.5–5.2)
ALT: 21 U/L (ref 0–53)
AST: 20 U/L (ref 0–37)
Alkaline Phosphatase: 91 U/L (ref 39–117)
BILIRUBIN TOTAL: 0.4 mg/dL (ref 0.2–1.2)
Bilirubin, Direct: 0 mg/dL (ref 0.0–0.3)
Total Protein: 7 g/dL (ref 6.0–8.3)

## 2018-04-12 LAB — BASIC METABOLIC PANEL
BUN: 14 mg/dL (ref 6–23)
CHLORIDE: 105 meq/L (ref 96–112)
CO2: 28 mEq/L (ref 19–32)
Calcium: 9.3 mg/dL (ref 8.4–10.5)
Creatinine, Ser: 1 mg/dL (ref 0.40–1.50)
GFR: 102.88 mL/min (ref >60.00)
Glucose, Bld: 102 mg/dL — ABNORMAL HIGH (ref 70–99)
POTASSIUM: 4 meq/L (ref 3.5–5.1)
SODIUM: 141 meq/L (ref 135–145)

## 2018-04-12 LAB — C-REACTIVE PROTEIN: CRP: 1.2 mg/dL (ref 0.5–20.0)

## 2018-04-12 LAB — TSH: TSH: 2.09 u[IU]/mL (ref 0.35–4.50)

## 2018-04-12 MED ORDER — OMEPRAZOLE 40 MG PO CPDR: 40.0000 mg | DELAYED_RELEASE_CAPSULE | Freq: Two times a day (BID) | ORAL | 2 refills | Status: DC

## 2018-04-12 MED ORDER — POLYETHYLENE GLYCOL 3350 17 G PO PACK: 17.0000 g | PACK | Freq: Every day | ORAL | 0 refills | Status: DC

## 2018-04-12 MED ORDER — CALCIUM POLYCARBOPHIL 625 MG PO TABS: 625.0000 mg | ORAL_TABLET | Freq: Every day | ORAL | Status: DC

## 2018-04-12 NOTE — Patient Instructions (Addendum)
If you are age 47 or older, your body mass index should be between 23-30. Your Body mass index is 30.28 kg/m. If this is out of the aforementioned range listed, please consider follow up with your Primary Care Provider.  If you are age 81 or younger, your body mass index should be between 19-25. Your Body mass index is 30.28 kg/m. If this is out of the aformentioned range listed, please consider follow up with your Primary Care Provider.   Please go to the lab in the basement of our building to have lab work done as you leave today. Hit "B" for basement when you get on the elevator.  When the doors open the lab is on your left.  We will call you with the results. Thank you.  Please go to Radiology in the basement for an Xray of your abdomen.  We have sent the following medications to your pharmacy for you to pick up at your convenience: Omeprazole 40mg : Take twice daily  Use Miralax, as directed, daily.  We are giving you samples of fiber choice chewables today.  Use Fibercon once daily.  Thank you for entrusting me with your care, Dr. Justice Britain

## 2018-04-12 NOTE — Progress Notes (Signed)
[facility] VISIT   Primary Care Provider Via, Lennette Bihari, MD [place] [address] [phone]  Referring Provider Via, Lennette Bihari, MD [address] [place], [place] [address] [phone]  Patient Profile: Daniel Russell is a 47 y.o. male with a pmh significant for Pre-DM, Asthma, Chest Wall Lipoma.  The patient presents to the [facility] for an evaluation and management of problem(s) noted below:  Problem List 1. Abdominal pain, chronic, epigastric   2. Change in bowel habit   3. Constipation, unspecified constipation type     History of Present Illness: This is the patient's first visit to the outpatient of our GI clinic.  He presents for the above noted issues.  He states that he had new onset abdominal pain starting at 1-1/2 weeks ago.  He remembers eating at [facility] and developed significant discomfort in the midepigastrium that was sharp and stabbing in quality.  He does not recall ever having this type of pain previously.  If he eats anything or drinks anything by the time it hits his stomach he has a significant patient had new onset abdominal pain starting last week.  The patient has a family history of stomach cancer in his mother.  The patient also describes a significant change in his bowel habits such that he has become constipated when he usually does not have that type of issue.  If previously pose the question of how many bowel movements a day he has patient states that he would normally go 2 times daily.  However over the course the last week and 1/2 to 2 weeks he has had a change such as having constipation.  Most recently he has not had a bowel movement since Sunday.  He has had to strain but is noted no hematochezia.  When he has had previous issues of constipation for a day or so he would take a laxative and that would be helpful however this is not been helpful over the course the last week and a half.  He recently  was initiated on amoxicillin for the last 2 weeks because of a significant dental abscess which he thinks is clearing things up.  He has taken some ibuprofen over this course of time but states he thinks it is less than 1200 mg total.  He is noted no melena or hematochezia.  He has never had an upper or lower endoscopy.  GI Review of Systems Positive as above including infrequent pyrosis Negative for dysphagia, odynophagia, jaundice, weight loss  Review of Systems General: Denies fevers/chills HEENT: Denies oral lesions Cardiovascular: Denies chest pain Pulmonary: Denies shortness of breath Gastroenterological: See HPI Genitourinary: Denies darkened urine Hematological: Denies easy bruising/bleeding Endocrine: Denies temperature intolerance Dermatological: Denies jaundice Psychological: Mood is anxious about his causing his issues Musculoskeletal: Denies new arthralgias   Medications Current Outpatient Medications  Medication Sig Dispense Refill  . albuterol (PROVENTIL HFA;VENTOLIN HFA) 108 (90 Base) MCG/ACT inhaler Inhale 1-2 puffs into the lungs every 6 (six) hours as needed for wheezing or shortness of breath. 1 Inhaler 0  . amoxicillin (AMOXIL) 500 MG capsule Take 1 capsule by mouth 3 (three) times daily.  0  . omeprazole (PRILOSEC) 40 MG capsule Take 1 capsule (40 mg total) by mouth 2 (two) times daily. 60 capsule 2  . polycarbophil (FIBERCON) 625 MG tablet Take 1 tablet (625 mg total) by mouth daily.    . polyethylene glycol (MIRALAX) packet Take 17 g by mouth daily. 14 each 0  No current facility-administered medications for this visit.     Allergies No Known Allergies  Histories Past Medical History:  Diagnosis Date  . Asthma   . Back pain   . Heart murmur   . Lipoma of chest wall    left chest  . Pre-diabetes    Past Surgical History:  Procedure Laterality Date  . KNEE ARTHROSCOPY    . LIPOMA EXCISION Right 06/14/2017   Procedure: EXCISION SUBCUTANEOUS  LIPOMA-LEFT CHEST WALL;  Surgeon: Donnie Mesa, MD;  Location: [facility];  Service: General;  Laterality: Right;   Social History   Socioeconomic History  . Marital status: Married    Spouse name: Not on file  . Number of children: 6  . Years of education: Not on file  . Highest education level: Not on file  Occupational History  . Occupation: Glass blower/designer  Social Needs  . Financial resource strain: Not on file  . Food insecurity:    Worry: Not on file    Inability: Not on file  . Transportation needs:    Medical: Not on file    Non-medical: Not on file  Tobacco Use  . Smoking status: Former Smoker    Packs/day: 0.50    Types: Cigarettes    Last attempt to quit: 04/12/2018  . Smokeless tobacco: Never Used  Substance and Sexual Activity  . Alcohol use: No  . Drug use: No  . Sexual activity: Not on file  Lifestyle  . Physical activity:    Days per week: Not on file    Minutes per session: Not on file  . Stress: Not on file  Relationships  . Social connections:    Talks on phone: Not on file    Gets together: Not on file    Attends religious service: Not on file    Active member of club or organization: Not on file    Attends meetings of clubs or organizations: Not on file    Relationship status: Not on file  . Intimate partner violence:    Fear of current or ex partner: Not on file    Emotionally abused: Not on file    Physically abused: Not on file    Forced sexual activity: Not on file  Other Topics Concern  . Not on file  Social History Narrative  . Not on file   Family History  Problem Relation Age of Onset  . Breast cancer Mother   . Hypertension Mother   . Lung cancer Father   . Diabetes Maternal Grandmother   . Diabetes Maternal Grandfather   . Diabetes Paternal Grandmother   . Lung cancer Paternal Aunt   . Stomach cancer Paternal Uncle    I have reviewed his medical, social, and family history in detail and updated the  electronic medical record as necessary.    PHYSICAL EXAMINATION  BP 120/80 (BP Location: Left Arm, Patient Position: Sitting, Cuff Size: Normal)   Pulse 68   Ht 5\' 10"  (1.778 m) Comment: pt stated height  Wt 211 lb (95.7 kg)   BMI 30.28 kg/m  Wt Readings from Last 3 Encounters:  04/12/18 211 lb (95.7 kg)  12/27/17 205 lb (93 kg)  08/21/17 206 lb (93.4 kg)  GEN: NAD, appears stated age, doesn't appear chronically ill, accompanied by wife PSYCH: Cooperative, without pressured speech EYE: Conjunctivae pink, sclerae anicteric ENT: MMM, without oral ulcers, no erythema or exudates noted NECK: Supple CV: RR without R/Gs  RESP: CTAB posteriorly, without  wheezing GI: NABS, soft, tenderness to palpation in the midepigastrium, appreciated on palpation, no rebound, unable to appreciate hepatosplenomegaly MSK/EXT: No LE edema SKIN: No jaundice NEURO:  Alert & Oriented x 3, no focal deficits   REVIEW OF DATA  I reviewed the following data at the time of this encounter:  GI Procedures and Studies  No relevant studies to review  Laboratory Studies  Reviewed in epic  Imaging Studies  No relevant studies to review   ASSESSMENT  Daniel Russell is a 47 y.o. male  with a pmh significant for Pre-DM, Asthma, Chest Wall Lipoma.  The patient is seen today for evaluation and management of:  1. Abdominal pain, chronic, epigastric   2. Change in bowel habit   3. Constipation, unspecified constipation type    This is a hemodynamically stable patient who presents for the above related issues.  He has had a significant change in his bowel habits as well as the development of midepigastric abdominal discomfort.  I suspect his issues are stemming from underlying constipation but the etiology was to why he has developed constipation is not clear to me at this point in time.  We will obtain lab work to try and evaluate for etiologies that may be able to be treated.  We will initiate aggressive laxative  regimen to try and help him go and move forward.  I am not completely convinced that this is peptic ulcer disease but in the setting of everything is going on I think reasonable to give him a trial for the next 6 to 8 weeks high-dose PPI therapy.  If the aforementioned laboratories as well as therapeutics that are outlined below do not help the patient we will consider additional imaging including cross-sectional imaging as well as endoscopic evaluation including an upper and lower endoscopy.  All patient questions were answered, to the best of my ability, and the patient agrees to the aforementioned plan of action with follow-up as indicated.   PLAN  Labs as outlined below Obtain a flat and upright KUB to rule out stool burden and/or more significant disease Initiate MiraLAX once to 2 times daily (1 capful) Initiate Colace 100 mg twice daily Initiate fiber supplementation (Benefiber/Metamucil/Citrucel/FiberCon/Gummies) 1-2 times daily If no bowel movement within the next 24 hours then initiate bisacodyl by mouth and consider initiation of bisacodyl per rectum suppository versus glycerin suppository Initiate PPI twice daily 40 mg If work-up above is unrevealing then patient will require a possible upper endoscopy/colonoscopy and/or additional cross-sectional imaging including possible CT with contrast  Orders Placed This Encounter  Procedures  . DG Abd 2 Views  . CBC  . Basic metabolic panel  . Amylase  . Lipase  . Hepatic function panel  . TSH  . C-reactive protein  . Calcium, ionized  . Urinalysis    New Prescriptions   OMEPRAZOLE (PRILOSEC) 40 MG CAPSULE    Take 1 capsule (40 mg total) by mouth 2 (two) times daily.   POLYCARBOPHIL (FIBERCON) 625 MG TABLET    Take 1 tablet (625 mg total) by mouth daily.   POLYETHYLENE GLYCOL (MIRALAX) PACKET    Take 17 g by mouth daily.   Modified Medications   No medications on file    Planned Follow Up: No follow-ups on file.   Justice Britain, MD [facility] Office # [phone]

## 2018-04-13 ENCOUNTER — Telehealth: Payer: Self-pay | Admitting: Gastroenterology

## 2018-04-13 LAB — CALCIUM, IONIZED: CALCIUM ION: 5.17 mg/dL (ref 4.8–5.6)

## 2018-04-13 NOTE — Telephone Encounter (Signed)
Attempted call back to the patient however he did not answer he went to voicemail.  I then contact the patient's wife who had called. She describes the patient still having significant discomfort and he relayed to her that the pain was worsening.  I reviewed the patient's labs which were drawn yesterday in the afternoon and have reviewed the results of today.  I also reviewed his KUB.  No evidence of perforation however he has a significant stool burden.  Nothing that looks concerning for overt obstruction.  The patient's labs show a mild leukocytosis but otherwise electrolytes seem to be intact no evidence of pancreatitis.  His CRP is normal making inflammation less likely. The patient's wife states that he started having some small bowel movements yesterday night. I have asked the patient's wife to continue to help get the patient moving with bowel regimen as we had discussed yesterday as well as considering suppositories from below to try and help mitigate things from the bottom as well. I think most of his issues right now are result of change in bowel habits and constipation. With that being said I think if the patient's discomfort is progressive and worsening I agree with the patient's wife who asked husband to potentially go to the ED to be further evaluated. I think however we should be able to try and monitor things closely. I will have advanced RN Gerarda Fraction or coverage reach out to the patient on Friday 12/6 and if the patient is still having progressive or persistent symptoms and having bowel movements I think it is reasonable for the patient to then undergo a cross-sectional CT abdomen pelvis with IV contrast and oral contrast to try and further delineate and ensure that there is no other etiology within the patient's abdomen that is causing his progressive symptoms. Very unlikely though in the setting of mild leukocytosis he could have diverticulitis that may be causing his symptoms to have  significant change in bowel habits and thus that would be reasonable and something that we need to know.  I will be away this weekend but will place coverage for follow-up if the patient ends up having his CT scan on Friday. Patty, please reach out to the patient tomorrow or his wife and then proceed with trying to schedule a CT scan if symptoms are persisting. Thank you.

## 2018-04-13 NOTE — Telephone Encounter (Signed)
Daniel Russell, please reach out to the patient tomorrow or his wife and then proceed with trying to schedule a CT scan if symptoms are persisting. Thank you

## 2018-04-13 NOTE — Telephone Encounter (Signed)
The pt is aware that Dr Rush Landmark has not reviewed the labs and as soon as resulted we will call.  Pt agreed.

## 2018-04-13 NOTE — Telephone Encounter (Signed)
Dr Rush Landmark the pt is calling for results to labs.  Daniel Russell wife also states that Daniel Russell abd pain is getting worse.  Please advise.

## 2018-04-14 NOTE — Telephone Encounter (Signed)
The pt's wife states that the pt is about the same.  She has not started the bowel regimen as of yet, but she will begin that today.  (she had to wait to get paid) If he is no better after starting the bowel regimen she will take him to the ED if worse or no better after bowel movement.  She will call on Monday with an update on pt condition.

## 2018-04-15 ENCOUNTER — Encounter: Payer: Self-pay | Admitting: Gastroenterology

## 2018-04-15 DIAGNOSIS — R194 Change in bowel habit: Secondary | ICD-10-CM | POA: Insufficient documentation

## 2018-04-15 DIAGNOSIS — R1013 Epigastric pain: Secondary | ICD-10-CM | POA: Insufficient documentation

## 2018-04-15 DIAGNOSIS — K5900 Constipation, unspecified: Secondary | ICD-10-CM | POA: Insufficient documentation

## 2018-05-12 ENCOUNTER — Ambulatory Visit: Payer: BLUE CROSS/BLUE SHIELD | Admitting: Gastroenterology

## 2018-06-05 ENCOUNTER — Other Ambulatory Visit: Payer: Self-pay

## 2018-06-05 ENCOUNTER — Emergency Department (HOSPITAL_COMMUNITY)
Admission: EM | Admit: 2018-06-05 | Discharge: 2018-06-05 | Disposition: A | Discharge status: Home / Self Care | Payer: BLUE CROSS/BLUE SHIELD | Attending: Emergency Medicine | Admitting: Emergency Medicine

## 2018-06-05 ENCOUNTER — Encounter (HOSPITAL_COMMUNITY): Payer: Self-pay | Admitting: Emergency Medicine

## 2018-06-05 DIAGNOSIS — G8929 Other chronic pain: Secondary | ICD-10-CM | POA: Diagnosis not present

## 2018-06-05 DIAGNOSIS — Z87891 Personal history of nicotine dependence: Secondary | ICD-10-CM | POA: Insufficient documentation

## 2018-06-05 DIAGNOSIS — M5441 Lumbago with sciatica, right side: Secondary | ICD-10-CM | POA: Insufficient documentation

## 2018-06-05 DIAGNOSIS — M545 Low back pain: Secondary | ICD-10-CM | POA: Diagnosis not present

## 2018-06-05 DIAGNOSIS — Z79899 Other long term (current) drug therapy: Secondary | ICD-10-CM | POA: Diagnosis not present

## 2018-06-05 DIAGNOSIS — J45909 Unspecified asthma, uncomplicated: Secondary | ICD-10-CM | POA: Insufficient documentation

## 2018-06-05 MED ORDER — DICLOFENAC 18 MG PO CAPS: 18.0000 mg | ORAL_CAPSULE | Freq: Every day | ORAL | 0 refills | Status: AC

## 2018-06-05 MED ORDER — PREDNISONE 10 MG (21) PO TBPK: ORAL_TABLET | ORAL | 0 refills | Status: DC

## 2018-06-05 MED ORDER — METHOCARBAMOL 500 MG PO TABS: 500.0000 mg | ORAL_TABLET | Freq: Two times a day (BID) | ORAL | 0 refills | Status: DC

## 2018-06-05 NOTE — ED Provider Notes (Signed)
[facility] DEPT Provider Note   CSN: [id number] Arrival date & time: 06/05/18  1006     History   Chief Complaint Chief Complaint  Patient presents with  . Back Pain    HPI Daniel Russell is a 48 y.o. male.  HPI   Daniel Russell is a 48 y.o. male, with a history of asthma and back pain, presenting to the ED with right lower back pain for the past week.  This has been a recurrent issue for the last 2 years.  Pain is described as a tightness, moderate to severe, radiating into the right buttocks.  Also requests referral to gastroenterology so he can undergo colonoscopy.  States he has a history of a few different types of cancer in his family and would like to be screened for some of them, including colon and stomach cancer. He does not have any symptoms in these areas.  Denies falls/trauma, abdominal pain, numbness, weakness, bowel or bladder dysfunction, saddle anesthesias, urinary symptoms, or any other complaints.     Past Medical History:  Diagnosis Date  . Asthma   . Back pain   . Heart murmur   . Lipoma of chest wall    left chest  . Pre-diabetes     Patient Active Problem List   Diagnosis Date Noted  . Abdominal pain, chronic, epigastric 04/15/2018  . Change in bowel habit 04/15/2018  . Constipation 04/15/2018    Past Surgical History:  Procedure Laterality Date  . KNEE ARTHROSCOPY    . LIPOMA EXCISION Right 06/14/2017   Procedure: EXCISION SUBCUTANEOUS LIPOMA-LEFT CHEST WALL;  Surgeon: Donnie Mesa, MD;  Location: [facility];  Service: General;  Laterality: Right;        Home Medications    Prior to Admission medications   Medication Sig Start Date End Date Taking? Authorizing Provider  albuterol (PROVENTIL HFA;VENTOLIN HFA) 108 (90 Base) MCG/ACT inhaler Inhale 1-2 puffs into the lungs every 6 (six) hours as needed for wheezing or shortness of breath. 12/27/17  Yes Petrucelli, Samantha R, PA-C    pramoxine-hydrocortisone (PROCTOCREAM-HC) 1-1 % rectal cream Place 1 application rectally 2 (two) times daily as needed for hemorrhoids or anal itching.   Yes [provider]  Diclofenac 18 MG CAPS Take 18 mg by mouth daily for 7 days. 06/05/18 06/12/18  Joy, Shawn C, PA-C  methocarbamol (ROBAXIN) 500 MG tablet Take 1 tablet (500 mg total) by mouth 2 (two) times daily. 06/05/18   Joy, Shawn C, PA-C  omeprazole (PRILOSEC) 40 MG capsule Take 1 capsule (40 mg total) by mouth 2 (two) times daily. Patient not taking: Reported on 06/05/2018 04/12/18   Mansouraty, Telford Nab., MD  polycarbophil (FIBERCON) 625 MG tablet Take 1 tablet (625 mg total) by mouth daily. Patient not taking: Reported on 06/05/2018 04/12/18   Mansouraty, Telford Nab., MD  polyethylene glycol [facility]) packet Take 17 g by mouth daily. Patient not taking: Reported on 06/05/2018 04/12/18   Mansouraty, Telford Nab., MD  predniSONE (STERAPRED UNI-PAK 21 TAB) 10 MG (21) TBPK tablet Take 6 tabs for 2 days, 5 tabs for 2 days, 4 tabs for 2 days, 3 tabs for 2 days, 2 tabs for 2 days, 1 tab for 2 days 06/05/18   Lorayne Bender, PA-C    Family History Family History  Problem Relation Age of Onset  . Breast cancer Mother   . Hypertension Mother   . Lung cancer Father   . Diabetes Maternal Grandmother   .  Diabetes Maternal Grandfather   . Diabetes Paternal Grandmother   . Lung cancer Paternal Aunt   . Stomach cancer Paternal Uncle   . Colon cancer Neg Hx   . Esophageal cancer Neg Hx   . Inflammatory bowel disease Neg Hx   . Liver disease Neg Hx   . Pancreatic cancer Neg Hx   . Rectal cancer Neg Hx     Social History Social History   Tobacco Use  . Smoking status: Former Smoker    Packs/day: 0.50    Types: Cigarettes    Last attempt to quit: 04/12/2018    Years since quitting: 0.1  . Smokeless tobacco: Never Used  Substance Use Topics  . Alcohol use: No  . Drug use: No     Allergies   Patient has no known  allergies.   Review of Systems Review of Systems  Constitutional: Negative for chills, diaphoresis and fever.  Respiratory: Negative for shortness of breath.   Cardiovascular: Negative for chest pain and leg swelling.  Gastrointestinal: Negative for abdominal pain, diarrhea, nausea and vomiting.  Genitourinary: Negative for dysuria, flank pain and hematuria.  Musculoskeletal: Positive for back pain.  Neurological: Negative for weakness and numbness.  All other systems reviewed and are negative.    Physical Exam Updated Vital Signs BP 131/90 (BP Location: Left Arm)   Pulse 71   Temp 98.6 F (37 C) (Oral)   Resp 16   SpO2 100%   Physical Exam Vitals signs and nursing note reviewed.  Constitutional:      General: He is not in acute distress.    Appearance: He is well-developed. He is not diaphoretic.  HENT:     Head: Normocephalic and atraumatic.     Mouth/Throat:     Mouth: Mucous membranes are moist.     Pharynx: Oropharynx is clear.  Eyes:     Conjunctiva/sclera: Conjunctivae normal.  Neck:     Musculoskeletal: Neck supple.  Cardiovascular:     Rate and Rhythm: Normal rate and regular rhythm.     Pulses: Normal pulses.          Posterior tibial pulses are 2+ on the right side and 2+ on the left side.  Pulmonary:     Effort: Pulmonary effort is normal. No respiratory distress.  Abdominal:     Palpations: Abdomen is soft.     Tenderness: There is no abdominal tenderness. There is no guarding.  Musculoskeletal:       Back:     Right lower leg: No edema.     Left lower leg: No edema.     Comments: Normal motor function intact in all extremities. No midline spinal tenderness.   Lymphadenopathy:     Cervical: No cervical adenopathy.  Skin:    General: Skin is warm and dry.  Neurological:     Mental Status: He is alert.     Comments: Sensation grossly intact to light touch in the lower extremities bilaterally. No saddle anesthesias. Strength 5/5 in the bilateral  lower extremities. Antalgic gait, but ambulates without assistance.  Psychiatric:        Mood and Affect: Mood and affect normal.        Speech: Speech normal.        Behavior: Behavior normal.      ED Treatments / Results  Labs (all labs ordered are listed, but only abnormal results are displayed) Labs Reviewed - No data to display  EKG None  Radiology No results found.  Procedures Procedures (including critical care time)  Medications Ordered in ED Medications - No data to display   Initial Impression / Assessment and Plan / ED Course  I have reviewed the triage vital signs and the nursing notes.  Pertinent labs & imaging results that were available during my care of the patient were reviewed by me and considered in my medical decision making (see chart for details).     Patient presents with acute on chronic lower back pain with symptoms of sciatica.  No neurovascular deficits. The patient was given instructions for home care as well as return precautions. Patient voices understanding of these instructions, accepts the plan, and is comfortable with discharge.   A long discussion was had with this patient regarding his concerns about cancer.  I spent time educating the patient on this matter to the best of my ability.  Final Clinical Impressions(s) / ED Diagnoses   Final diagnoses:  Chronic right-sided low back pain with right-sided sciatica    ED Discharge Orders         Ordered    predniSONE (STERAPRED UNI-PAK 21 TAB) 10 MG (21) TBPK tablet     06/05/18 1203    Diclofenac 18 MG CAPS  Daily     06/05/18 1203    methocarbamol (ROBAXIN) 500 MG tablet  2 times daily     06/05/18 1203           Layla Maw 06/05/18 1207    Charlesetta Shanks, MD 06/11/18 [phone]

## 2018-06-05 NOTE — Discharge Instructions (Signed)
Take it easy, but do not lay around too much as this may make any stiffness worse.  Antiinflammatory medications: Take 600 mg of ibuprofen every 6 hours or 440 mg (over the counter dose) to 500 mg (prescription dose) of naproxen every 12 hours for the next 3 days. After this time, these medications may be used as needed for pain. Take these medications with food to avoid upset stomach. Choose only one of these medications, do not take them together. Acetaminophen (generic for Tylenol): Should you continue to have additional pain while taking the ibuprofen or naproxen, you may add in acetaminophen as needed. Your daily total maximum amount of acetaminophen from all sources should be limited to 4000mg /day for persons without liver problems, or 2000mg /day for those with liver problems. Diclofenac: As an alternative to the above anti-inflammatory medications, such as ibuprofen or naproxen, may try the diclofenac.  Do not take diclofenac at the same time as other anti-inflammatory medications. Prednisone: Take this medication, as prescribed, until finished. Muscle relaxer: Robaxin is a muscle relaxer and may help loosen stiff muscles. Do not take the Robaxin while driving or performing other dangerous activities.  Exercises: Be sure to perform the attached exercises starting with three times a week and working up to performing them daily. This is an essential part of preventing long term problems.  Follow up: Follow up with a primary care provider for any future management of these complaints. Be sure to follow up within 7-10 days. Return: Return to the ED should symptoms worsen.  For prescription assistance, may try using prescription discount sites or apps, such as goodrx.com
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
## 2018-06-05 NOTE — ED Provider Notes (Signed)
1:05 PM  [facility] pharmacy called stating they don't have the diclofenac 18 mg capsules or the 35 mg capsules.  They do have 25, 50, and 75 mg tablets of diclofenac potassium.  I spoke with their pharmacist, Garen Grams, who states equivalent, entry-level dosing of diclofenac potassium logically would start at 25 mg 3 times daily.  The diclofenac order was verbally changed using this recommendation.   Lorayne Bender, PA-C 06/05/18 1311    Charlesetta Shanks, MD 06/11/18 [phone]

## 2018-06-07 ENCOUNTER — Ambulatory Visit (INDEPENDENT_AMBULATORY_CARE_PROVIDER_SITE_OTHER): Payer: Self-pay | Admitting: Family Medicine

## 2018-06-09 ENCOUNTER — Ambulatory Visit: Payer: BLUE CROSS/BLUE SHIELD | Admitting: Gastroenterology

## 2018-06-19 ENCOUNTER — Emergency Department (HOSPITAL_COMMUNITY)
Admission: EM | Admit: 2018-06-19 | Discharge: 2018-06-19 | Disposition: A | Discharge status: Home / Self Care | Payer: BLUE CROSS/BLUE SHIELD | Attending: Emergency Medicine | Admitting: Emergency Medicine

## 2018-06-19 ENCOUNTER — Encounter (HOSPITAL_COMMUNITY): Payer: Self-pay

## 2018-06-19 ENCOUNTER — Other Ambulatory Visit: Payer: Self-pay

## 2018-06-19 DIAGNOSIS — K5900 Constipation, unspecified: Secondary | ICD-10-CM | POA: Insufficient documentation

## 2018-06-19 DIAGNOSIS — Z87891 Personal history of nicotine dependence: Secondary | ICD-10-CM | POA: Insufficient documentation

## 2018-06-19 DIAGNOSIS — J45909 Unspecified asthma, uncomplicated: Secondary | ICD-10-CM | POA: Insufficient documentation

## 2018-06-19 DIAGNOSIS — Z79899 Other long term (current) drug therapy: Secondary | ICD-10-CM | POA: Insufficient documentation

## 2018-06-19 DIAGNOSIS — M545 Low back pain: Secondary | ICD-10-CM | POA: Diagnosis not present

## 2018-06-19 DIAGNOSIS — M5441 Lumbago with sciatica, right side: Secondary | ICD-10-CM | POA: Insufficient documentation

## 2018-06-19 NOTE — ED Triage Notes (Signed)
Pt states that he has had a difficult time having BMs since November. Pt states he is having lower right side back pain that is "penetrating". Pt seen several weeks ago for same. Pt states he has taken steroids and robaxin without relief.

## 2018-06-19 NOTE — Discharge Instructions (Signed)
You have been seen today for back pain and constipation. Please read and follow all provided instructions. Return to the emergency room for worsening condition or new concerning symptoms.    1. Medications:  Continue usual home medications Take medications as prescribed. Please review all of the medicines and only take them if you do not have an allergy to them.  2. Treatment: rest, drink plenty of fluids 3. Follow Up:  -Please follow up with your primary doctor in 2-5 days for discussion of your diagnoses and further evaluation after today's visit; Call today to arrange your follow up.  If your PCP feels that you need an MRI and they can schedule one outpatient for you. -Keep your appointment scheduled for tomorrow with the GI.  You can discuss possible treatments for constipation.  ?
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
## 2018-06-20 ENCOUNTER — Encounter: Payer: Self-pay | Admitting: Gastroenterology

## 2018-06-20 ENCOUNTER — Ambulatory Visit (INDEPENDENT_AMBULATORY_CARE_PROVIDER_SITE_OTHER): Payer: BLUE CROSS/BLUE SHIELD | Admitting: Gastroenterology

## 2018-06-20 VITALS — BP 120/60 | HR 70 | Ht 70.0 in | Wt 207.0 lb

## 2018-06-20 DIAGNOSIS — K5900 Constipation, unspecified: Secondary | ICD-10-CM | POA: Diagnosis not present

## 2018-06-20 DIAGNOSIS — R1013 Epigastric pain: Secondary | ICD-10-CM

## 2018-06-20 DIAGNOSIS — R194 Change in bowel habit: Secondary | ICD-10-CM

## 2018-06-20 MED ORDER — PEG 3350-KCL-NA BICARB-NACL 420 G PO SOLR: 4000.0000 mL | Freq: Once | ORAL | 0 refills | Status: AC

## 2018-06-20 MED ORDER — OMEPRAZOLE 40 MG PO CPDR: 40.0000 mg | DELAYED_RELEASE_CAPSULE | Freq: Every day | ORAL | 2 refills | Status: DC

## 2018-06-20 NOTE — Progress Notes (Signed)
[facility] VISIT   Primary Care Provider Via, Lennette Bihari, MD [place] [id number] [phone]  Referring Provider Via, Lennette Bihari, MD [address] [place], [place] [address] [phone]  Patient Profile: Daniel Russell is a 48 y.o. male with a pmh significant for Pre-DM, Asthma, Chest Wall Lipoma.  The patient presents to the [facility] for an evaluation and management of problem(s) noted below:  Problem List 1. Abdominal pain, epigastric   2. Change in bowel habit   3. Constipation, unspecified constipation type     History of Present Illness: Please see initial consultation note for full details of HPI.  Interval History The patient states that he is having some improvement overall compared to when we first met him back in December however he is still having persistent pain.  The episodes are not lasting as long or as intense as they were previously however the chronicity of his discomfort is concerning to him.  He had used laxatives with some mild improvement in his bowel habits initially but then things went back to what they were before.  He has had to use magnesium citrate at times to help him go but even that does not work every time.  There is no blood in the stool.  Less than 50% of the time the pain is exacerbated by eating.  He still has mild pyrosis.  He has not been on PPI.  He describes very minimal though present dysphagia that has occurred infrequently over the last 2 months.  He is currently working 2 jobs as a Furniture conservator/restorer and things are getting a little stressful at work.  GI Review of Systems Positive as above including decreased appetite/anorexia Negative for odynophagia, jaundice, weight loss  Review of Systems General: Denies fevers/chills Cardiovascular: Denies chest pain Pulmonary: Denies shortness of breath Gastroenterological: See HPI Genitourinary: Denies darkened urine Hematological: Denies  easy bruising Dermatological: Denies jaundice Psychological: Mood seems calmer than we met him the first time   Medications Current Outpatient Medications  Medication Sig Dispense Refill  . albuterol (PROVENTIL HFA;VENTOLIN HFA) 108 (90 Base) MCG/ACT inhaler Inhale 1-2 puffs into the lungs every 6 (six) hours as needed for wheezing or shortness of breath. 1 Inhaler 0  . APPLE CIDER VINEGAR PO Take by mouth. Takes 2 gummies daily    . diclofenac (VOLTAREN) 25 MG EC tablet Take 25 mg by mouth 3 (three) times daily.    . methocarbamol (ROBAXIN) 500 MG tablet Take 1 tablet (500 mg total) by mouth 2 (two) times daily. 20 tablet 0  . polycarbophil (FIBERCON) 625 MG tablet Take 1 tablet (625 mg total) by mouth daily.    Marland Kitchen omeprazole (PRILOSEC) 40 MG capsule Take 1 capsule (40 mg total) by mouth daily. 30 capsule 2  . tiZANidine (ZANAFLEX) 2 MG tablet Take 1-2 tablets (2-4 mg total) by mouth at bedtime as needed for muscle spasms. 60 tablet 1   No current facility-administered medications for this visit.     Allergies No Known Allergies  Histories Past Medical History:  Diagnosis Date  . Asthma   . Back pain   . Heart murmur   . Lipoma of chest wall    left chest  . Pre-diabetes    Past Surgical History:  Procedure Laterality Date  . KNEE ARTHROSCOPY    . LIPOMA EXCISION Right 06/14/2017   Procedure: EXCISION SUBCUTANEOUS LIPOMA-LEFT CHEST WALL;  Surgeon: Donnie Mesa, MD;  Location: [facility];  Service: General;  Laterality: Right;   Social History   Socioeconomic History  . Marital status: Married    Spouse name: Not on file  . Number of children: 6  . Years of education: Not on file  . Highest education level: Not on file  Occupational History  . Occupation: Glass blower/designer  Social Needs  . Financial resource strain: Not on file  . Food insecurity:    Worry: Not on file    Inability: Not on file  . Transportation needs:    Medical: Not on file     Non-medical: Not on file  Tobacco Use  . Smoking status: Former Smoker    Packs/day: 0.50    Types: Cigarettes    Last attempt to quit: 04/12/2018    Years since quitting: 0.1  . Smokeless tobacco: Never Used  Substance and Sexual Activity  . Alcohol use: No  . Drug use: No  . Sexual activity: Not on file  Lifestyle  . Physical activity:    Days per week: Not on file    Minutes per session: Not on file  . Stress: Not on file  Relationships  . Social connections:    Talks on phone: Not on file    Gets together: Not on file    Attends religious service: Not on file    Active member of club or organization: Not on file    Attends meetings of clubs or organizations: Not on file    Relationship status: Not on file  . Intimate partner violence:    Fear of current or ex partner: Not on file    Emotionally abused: Not on file    Physically abused: Not on file    Forced sexual activity: Not on file  Other Topics Concern  . Not on file  Social History Narrative  . Not on file   Family History  Problem Relation Age of Onset  . Breast cancer Mother   . Hypertension Mother   . Lung cancer Father   . Diabetes Maternal Grandmother   . Diabetes Maternal Grandfather   . Diabetes Paternal Grandmother   . Lung cancer Paternal Aunt   . Stomach cancer Paternal Uncle   . Colon cancer Neg Hx   . Esophageal cancer Neg Hx   . Inflammatory bowel disease Neg Hx   . Liver disease Neg Hx   . Pancreatic cancer Neg Hx   . Rectal cancer Neg Hx    I have reviewed his medical, social, and family history in detail and updated the electronic medical record as necessary.    PHYSICAL EXAMINATION  BP 120/60   Pulse 70   Ht 5' 10"  (1.778 m)   Wt 207 lb (93.9 kg)   BMI 29.70 kg/m  Wt Readings from Last 3 Encounters:  06/20/18 207 lb (93.9 kg)  06/19/18 202 lb (91.6 kg)  04/12/18 211 lb (95.7 kg)  GEN: NAD, appears stated age, doesn't appear chronically ill PSYCH: Cooperative, without  pressured speech EYE: Conjunctivae pink, sclerae anicteric ENT: MMM CV: RR without R/Gs  RESP: CTAB posteriorly, without wheezing GI: NABS, soft, minimal tenderness to palpation in the midepigastrium, non-distended, no hepatosplenomegaly, no rebound MSK/EXT: No LE edema SKIN: No jaundice NEURO:  Alert & Oriented x 3, no focal deficits   REVIEW OF DATA  I reviewed the following data at the time of this encounter:  GI Procedures and Studies  No relevant studies to review  Laboratory Studies  Reviewed in epic  Imaging Studies  No new relevant studies to review   ASSESSMENT  Mr. Lipson is a 48 y.o. male  with a pmh significant for Pre-DM, Asthma, Chest Wall Lipoma.  The patient is seen today for evaluation and management of:  1. Abdominal pain, epigastric   2. Change in bowel habit   3. Constipation, unspecified constipation type    The patient remains hemodynamically stable.  Overall his symptoms have been stable to slightly better however they have not gone away completely.  Based on the chronicity of the symptoms I think it is reasonable for [place] to proceed with a diagnostic upper and lower endoscopy to rule out etiologies such as peptic ulcer disease enteropathy as well as any sort of colonic issues that could be causing issues for the patient.  His changes in bowel habits have remained in regards to the constipation that he previously had not had any issues with years prior.  As such a diagnostic colonoscopy for work-up of that is reasonable and based on him being African-American he would meet criteria to be considered for colon cancer screening.  As such we will proceed with both the upper and lower.  I would like to obtain an abdominal ultrasound in the interim.  If his procedures do not show [place] any signs or symptoms of a diagnosis we will have to consider a cross-sectional CT.  We will start with an abdominal ultrasound.  We will give him samples for Linzess see if this may be  helpful for him in regards to his constipation.  Initiation of PPI as the patient has not been taking it is important.  The risks and benefits of endoscopic evaluation were discussed with the patient; these include but are not limited to the risk of perforation, infection, bleeding, missed lesions, lack of diagnosis, severe illness requiring hospitalization, as well as anesthesia and sedation related illnesses.  The patient is agreeable to proceed.  All patient questions were answered, to the best of my ability, and the patient agrees to the aforementioned plan of action with follow-up as indicated.   PLAN  Abdominal ultrasound to be completed Proceed with scheduling EGD/colonoscopy diagnostic procedures Reinitiate fiber supplementation once daily Continue Colace 100 mg twice daily as a stool softener Samples of Linzess 145 mcg daily to try and help optimize stools in setting of constipation chronicity Further work-up including cross-sectional imaging with contrast to be considered based on work-up   Orders Placed This Encounter  Procedures  . US Abdomen Complete  . Ambulatory referral to Gastroenterology    New Prescriptions   OMEPRAZOLE (PRILOSEC) 40 MG CAPSULE    Take 1 capsule (40 mg total) by mouth daily.   TIZANIDINE (ZANAFLEX) 2 MG TABLET    Take 1-2 tablets (2-4 mg total) by mouth at bedtime as needed for muscle spasms.   Modified Medications   No medications on file    Planned Follow Up: No follow-ups on file.   Justice Britain, MD [facility] Advanced [facility]oscopy Office # [phone]

## 2018-06-20 NOTE — Patient Instructions (Signed)
If you are age 48 or older, your body mass index should be between 23-30. Your Body mass index is 29.7 kg/m. If this is out of the aforementioned range listed, please consider follow up with your Primary Care Provider.  If you are age 33 or younger, your body mass index should be between 19-25. Your Body mass index is 29.7 kg/m. If this is out of the aformentioned range listed, please consider follow up with your Primary Care Provider.    You have been scheduled for an endoscopy and colonoscopy. Please follow the written instructions given to you at your visit today. Please pick up your prep supplies at the pharmacy within the next 1-3 days. If you use inhalers (even only as needed), please bring them with you on the day of your procedure. Your physician has requested that you go to www.startemmi.com and enter the access code given to you at your visit today. This web site gives a general overview about your procedure. However, you should still follow specific instructions given to you by our office regarding your preparation for the procedure.  You have been scheduled for an abdominal ultrasound at [facility] (1st floor of hospital) on 06/27/2018 at 11:00am. Please arrive 15 minutes prior to your appointment for registration. Make certain not to have anything to eat or drink after midnight the night before procedure. Should you need to reschedule your appointment, please contact radiology at [phone]. This test typically takes about 30 minutes to perform.  We have sent the following medications to your pharmacy for you to pick up at your convenience: Omeprazole 40mg  - once daily   We have given you samples of the following medication to take: Linzess 126mcg - Once daily   Thank you for choosing me and [facility].  Dr. Rush Landmark

## 2018-06-21 ENCOUNTER — Ambulatory Visit (INDEPENDENT_AMBULATORY_CARE_PROVIDER_SITE_OTHER): Payer: BLUE CROSS/BLUE SHIELD | Admitting: Family Medicine

## 2018-06-21 ENCOUNTER — Encounter (INDEPENDENT_AMBULATORY_CARE_PROVIDER_SITE_OTHER): Payer: Self-pay | Admitting: Family Medicine

## 2018-06-21 DIAGNOSIS — M5441 Lumbago with sciatica, right side: Secondary | ICD-10-CM | POA: Diagnosis not present

## 2018-06-21 MED ORDER — TIZANIDINE HCL 2 MG PO TABS: 2.0000 mg | ORAL_TABLET | Freq: Every evening | ORAL | 1 refills | Status: DC | PRN
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
## 2018-06-22 ENCOUNTER — Encounter: Payer: Self-pay | Admitting: Gastroenterology

## 2018-06-27 ENCOUNTER — Ambulatory Visit (HOSPITAL_COMMUNITY)
Admission: RE | Admit: 2018-06-27 | Discharge: 2018-06-27 | Disposition: A | Discharge status: Home / Self Care | Payer: BLUE CROSS/BLUE SHIELD | Source: Ambulatory Visit | Attending: Gastroenterology | Admitting: Gastroenterology

## 2018-06-27 DIAGNOSIS — R1013 Epigastric pain: Secondary | ICD-10-CM | POA: Diagnosis not present

## 2018-06-27 DIAGNOSIS — K5900 Constipation, unspecified: Secondary | ICD-10-CM

## 2018-06-27 DIAGNOSIS — R194 Change in bowel habit: Secondary | ICD-10-CM | POA: Diagnosis not present

## 2018-06-27 DIAGNOSIS — R109 Unspecified abdominal pain: Secondary | ICD-10-CM | POA: Diagnosis not present

## 2018-07-11 ENCOUNTER — Telehealth: Payer: Self-pay | Admitting: Gastroenterology

## 2018-07-11 NOTE — Telephone Encounter (Signed)
Left message on machine to call back     The results of your ultrasound have returned. You do not have any evidence of gallstones and the majority of your bile duct is normal in size. Your liver showed no evidence of any abnormalities and has normal blood flow. The spleen is of normal size and your kidneys are normal. Your blood vessels including your IVC and aorta are normal without any evidence of. The entirety of the pancreas including the head of the pancreas and the liver distal portion of the bile duct could not be visualized as a result of bowel gas. I am not sure that we need to further right now until we complete your upper and lower endoscopies. However if you continue to have symptoms do not find any cause for that with our procedures of the abdomen and we talked about previously.

## 2018-07-11 NOTE — Telephone Encounter (Signed)
The patient has been notified of this information and all questions answered. He will keep appt as planned  

## 2018-07-11 NOTE — Telephone Encounter (Signed)
Pt called inquiring about US results. °

## 2018-08-01 ENCOUNTER — Encounter: Payer: BLUE CROSS/BLUE SHIELD | Admitting: Gastroenterology

## 2018-08-15 ENCOUNTER — Emergency Department (HOSPITAL_COMMUNITY)
Admission: EM | Admit: 2018-08-15 | Discharge: 2018-08-15 | Disposition: A | Discharge status: Home / Self Care | Payer: BLUE CROSS/BLUE SHIELD | Attending: Emergency Medicine | Admitting: Emergency Medicine

## 2018-08-15 ENCOUNTER — Other Ambulatory Visit: Payer: Self-pay

## 2018-08-15 ENCOUNTER — Encounter (HOSPITAL_COMMUNITY): Payer: Self-pay | Admitting: Emergency Medicine

## 2018-08-15 DIAGNOSIS — J45909 Unspecified asthma, uncomplicated: Secondary | ICD-10-CM | POA: Insufficient documentation

## 2018-08-15 DIAGNOSIS — M545 Low back pain: Secondary | ICD-10-CM | POA: Diagnosis not present

## 2018-08-15 DIAGNOSIS — R7303 Prediabetes: Secondary | ICD-10-CM | POA: Diagnosis not present

## 2018-08-15 DIAGNOSIS — G8929 Other chronic pain: Secondary | ICD-10-CM | POA: Insufficient documentation

## 2018-08-15 DIAGNOSIS — M5441 Lumbago with sciatica, right side: Secondary | ICD-10-CM | POA: Diagnosis not present

## 2018-08-15 DIAGNOSIS — K5900 Constipation, unspecified: Secondary | ICD-10-CM | POA: Insufficient documentation

## 2018-08-15 DIAGNOSIS — R200 Anesthesia of skin: Secondary | ICD-10-CM | POA: Insufficient documentation

## 2018-08-15 DIAGNOSIS — Z79899 Other long term (current) drug therapy: Secondary | ICD-10-CM | POA: Insufficient documentation

## 2018-08-15 DIAGNOSIS — Z87891 Personal history of nicotine dependence: Secondary | ICD-10-CM | POA: Diagnosis not present

## 2018-08-15 MED ORDER — KETOROLAC TROMETHAMINE 30 MG/ML IJ SOLN
30.0000 mg | Freq: Once | INTRAMUSCULAR | Status: AC
  Administered 2018-08-15: 30 mg via INTRAMUSCULAR
  Filled 2018-08-15: qty 1

## 2018-08-15 MED ORDER — PREDNISONE 10 MG (21) PO TBPK: ORAL_TABLET | Freq: Every day | ORAL | 0 refills | Status: DC
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
## 2018-08-15 NOTE — ED Provider Notes (Signed)
[facility] DEPT Provider Note   CSN: [id number] Arrival date & time: 08/15/18  1311    History   Chief Complaint Chief Complaint  Patient presents with  . Back Pain    HPI Daniel Russell is a 48 y.o. male with a PMH of Asthma, chronic back pain, and constipation presenting with constant right sided low back pain onset 5 days ago. Patient describes pain as sharp burning pain and states it is worse with movement. Patient states he has tried diclofenac and muscle relaxant with partial relief. Patient denies taking any medications today. Patient reports back pain radiates down right posterior thigh. Patient states he has had chronic back pain since 2017 and states it is intermittently exacerbated. Patient reports tobacco use, but denies alcohol or drug use. Patient reports intermittent numbness. Patient reports constipation, but states he has had constipation for years and states his last BM was this morning. Patient states he is following up with GI regarding constipation. Patient denies abdominal pain or vomiting. Patient denies dysuria or frequency. Patient states he has also been evaluated by orthopedics in the past for back pain. Patient denies weakness, incontinence to bowel/bladder, fever, chills, IV drug use, or hx of cancer. Patient denies sick exposures or recent travel. Patient states he can walk, but it is painful.      HPI  Past Medical History:  Diagnosis Date  . Asthma   . Back pain   . Heart murmur   . Lipoma of chest wall    left chest  . Pre-diabetes     Patient Active Problem List   Diagnosis Date Noted  . Abdominal pain, epigastric 04/15/2018  . Change in bowel habit 04/15/2018  . Constipation 04/15/2018    Past Surgical History:  Procedure Laterality Date  . KNEE ARTHROSCOPY    . LIPOMA EXCISION Right 06/14/2017   Procedure: EXCISION SUBCUTANEOUS LIPOMA-LEFT CHEST WALL;  Surgeon: Donnie Mesa, MD;  Location: [facility];  Service: General;  Laterality: Right;        Home Medications    Prior to Admission medications   Medication Sig Start Date End Date Taking? Authorizing Provider  albuterol (PROVENTIL HFA;VENTOLIN HFA) 108 (90 Base) MCG/ACT inhaler Inhale 1-2 puffs into the lungs every 6 (six) hours as needed for wheezing or shortness of breath. 12/27/17   Petrucelli, Samantha R, PA-C  APPLE CIDER VINEGAR PO Take by mouth. Takes 2 gummies daily    [provider]  diclofenac (VOLTAREN) 25 MG EC tablet Take 25 mg by mouth 3 (three) times daily. 06/05/18   [provider]  methocarbamol (ROBAXIN) 500 MG tablet Take 1 tablet (500 mg total) by mouth 2 (two) times daily. 06/05/18   Joy, Shawn C, PA-C  omeprazole (PRILOSEC) 40 MG capsule Take 1 capsule (40 mg total) by mouth daily. 06/20/18   Mansouraty, Telford Nab., MD  polycarbophil (FIBERCON) 625 MG tablet Take 1 tablet (625 mg total) by mouth daily. 04/12/18   Mansouraty, Telford Nab., MD  predniSONE (STERAPRED UNI-PAK 21 TAB) 10 MG (21) TBPK tablet Take by mouth daily. Take 6 tabs by mouth daily  for 2 days, then 5 tabs for 2 days, then 4 tabs for 2 days, then 3 tabs for 2 days, 2 tabs for 2 days, then 1 tab by mouth daily for 2 days 08/15/18   Darlin Drop P, PA-C  tiZANidine (ZANAFLEX) 2 MG tablet Take 1-2 tablets (2-4 mg total) by mouth at bedtime as needed for muscle  spasms. 06/21/18   Hilts, Legrand Como, MD    Family History Family History  Problem Relation Age of Onset  . Breast cancer Mother   . Hypertension Mother   . Lung cancer Father   . Diabetes Maternal Grandmother   . Diabetes Maternal Grandfather   . Diabetes Paternal Grandmother   . Lung cancer Paternal Aunt   . Stomach cancer Paternal Uncle   . Colon cancer Neg Hx   . Esophageal cancer Neg Hx   . Inflammatory bowel disease Neg Hx   . Liver disease Neg Hx   . Pancreatic cancer Neg Hx   . Rectal cancer Neg Hx     Social History Social History   Tobacco  Use  . Smoking status: Former Smoker    Packs/day: 0.50    Types: Cigarettes    Last attempt to quit: 04/12/2018    Years since quitting: 0.3  . Smokeless tobacco: Never Used  Substance Use Topics  . Alcohol use: No  . Drug use: No     Allergies   Patient has no known allergies.   Review of Systems Review of Systems  Constitutional: Negative for activity change, chills, diaphoresis, fever and unexpected weight change.  Respiratory: Negative for cough and shortness of breath.   Cardiovascular: Negative for chest pain, palpitations and leg swelling.  Gastrointestinal: Positive for constipation (Pt states this is chronic.). Negative for abdominal pain, diarrhea, nausea and vomiting.  Genitourinary: Negative for difficulty urinating, dysuria, flank pain and hematuria.  Musculoskeletal: Positive for back pain and gait problem. Negative for arthralgias, joint swelling, myalgias, neck pain and neck stiffness.  Skin: Negative for rash.  Allergic/Immunologic: Negative for immunocompromised state.  Neurological: Positive for numbness. Negative for dizziness, syncope and weakness.  Hematological: Does not bruise/bleed easily.     Physical Exam Updated Vital Signs BP 119/88 (BP Location: Left Arm)   Pulse 88   Temp 98.7 F (37.1 C) (Oral)   Resp 16   Ht 5\' 10"  (1.778 m)   Wt 91.6 kg   SpO2 98%   BMI 28.98 kg/m   Physical Exam Physical Exam  Constitutional: Pt appears well-developed and well-nourished. No distress. Patient is joking and smiling throughout exam.  HENT:  Head: Normocephalic and atraumatic.  Mouth/Throat: Oropharynx is clear and moist. No oropharyngeal exudate.  Eyes: Conjunctivae are normal.  Neck: Normal range of motion. Neck supple. Full ROM without pain. No cervical midline or paraspinal tenderness noted. Cardiovascular: Normal rate, regular rhythm and intact distal pulses.   Pulmonary/Chest: Effort normal and breath sounds normal. No respiratory distress. Pt  has no wheezes.  Abdominal: Soft. Pt exhibits no distension. There is no tenderness.  Musculoskeletal:  Decreased range of motion of the T-spine, and L-spine. No tenderness to palpation of the spinous processes of the T-spine or L-spine. Tenderness to palpation of the right paraspinous muscles of the L-spine. Neurological: Pt is alert. Speech is clear and goal oriented, follows commands. Pt has normal reflexes. Reflex Scores:      Patellar reflexes are 2+ on the right side and 2+ on the left side.      Achilles reflexes are 2+ on the right side and 2+ on the left side. Normal 5/5 strength in upper and lower extremities bilaterally including dorsiflexion and plantar flexion, strong and equal grip strength. Sensation is normal to light touch. Moves extremities without ataxia, coordination intact. Patient is able to ambulate with some discomfort. Normal balance. Patient has 2+ DP pulses bilaterally.  Skin: Skin is  warm and dry. No rash, erythema, edema, or skin changes noted. Pt is not diaphoretic.  Psychiatric: Pt has a normal mood and affect. Behavior is normal.  Nursing note and vitals reviewed.   ED Treatments / Results  Labs (all labs ordered are listed, but only abnormal results are displayed) Labs Reviewed - No data to display  EKG None  Radiology No results found.  Procedures Procedures (including critical care time)  Medications Ordered in ED Medications  ketorolac (TORADOL) 30 MG/ML injection 30 mg (30 mg Intramuscular Given 08/15/18 1420)     Initial Impression / Assessment and Plan / ED Course  I have reviewed the triage vital signs and the nursing notes.  Pertinent labs & imaging results that were available during my care of the patient were reviewed by me and considered in my medical decision making (see chart for details).       Patient with back pain.  No neurological deficits and normal neuro exam.  Patient can walk but states is painful.  No loss of bowel or  bladder control.  No concern for cauda equina.  No fever, night sweats, weight loss, h/o cancer, IVDU.  Suspect back pain is acute on chronic. Do not suspect imaging is necessary at this time. Will prescribe a steroid taper. Advised patient to continue muscle relaxants and NSAIDs as previously prescribed since this has provided partial relief. RICE protocol indicated and discussed with patient. Discussed return precautions with patient. Advised patient to follow up with orthopedics if symptoms persists. Patient states he understands and agrees with plan.   Final Clinical Impressions(s) / ED Diagnoses   Final diagnoses:  Chronic right-sided low back pain with right-sided sciatica    ED Discharge Orders         Ordered    predniSONE (STERAPRED UNI-PAK 21 TAB) 10 MG (21) TBPK tablet  Daily     08/15/18 1459           Arville Lime, [place] 08/15/18 1502    Charlesetta Shanks, MD 08/16/18 [phone]

## 2018-08-15 NOTE — Discharge Instructions (Addendum)
You have been seen today for back pain. Please read and follow all provided instructions.   1. Medications: continue muscle relaxant and diclofenac as prescribed, start steroid taper (the prescription had to be printed because of the medication), usual home medications. Instructions for steroid taper: Take 6 tabs by mouth daily  for 2 days, then 5 tabs for 2 days, then 4 tabs for 2 days, then 3 tabs for 2 days, 2 tabs for 2 days, then 1 tab by mouth daily for 2 days. 2. Treatment: rest, drink plenty of fluids 3. Follow Up: Please follow up with your primary doctor in 2-5 days for discussion of your diagnoses and further evaluation after today's visit; if you do not have a primary care doctor use the resource guide provided to find one; Please return to the ER for any new or worsening symptoms. Please obtain all of your results from medical records or have your doctors office obtain the results - share them with your doctor - you should be seen at your doctors office. Call today to arrange your follow up.   Take medications as prescribed. Please review all of the medicines and only take them if you do not have an allergy to them. Return to the emergency room for worsening condition or new concerning symptoms. Follow up with your regular doctor. If you don't have a regular doctor use one of the numbers below to establish a primary care doctor.  Please be aware that if you are taking birth control pills, taking other prescriptions, ESPECIALLY ANTIBIOTICS may make the birth control ineffective - if this is the case, either do not engage in sexual activity or use alternative methods of birth control such as condoms until you have finished the medicine and your family doctor says it is OK to restart them. If you are on a blood thinner such as COUMADIN, be aware that any other medicine that you take may cause the coumadin to either work too much, or not enough - you should have your coumadin level rechecked in  next 7 days if this is the case.  ?  It is also a possibility that you have an allergic reaction to any of the medicines that you have been prescribed - Everybody reacts differently to medications and while MOST people have no trouble with most medicines, you may have a reaction such as nausea, vomiting, rash, swelling, shortness of breath. If this is the case, please stop taking the medicine immediately and contact your physician.  ?  You should return to the ER if you develop severe or worsening symptoms.   Emergency Department Resource Guide 1) Find a Doctor and Pay Out of Pocket Although you won't have to find out who is covered by your insurance plan, it is a good idea to ask around and get recommendations. You will then need to call the office and see if the doctor you have chosen will accept you as a new patient and what types of options they offer for patients who are self-pay. Some doctors offer discounts or will set up payment plans for their patients who do not have insurance, but you will need to ask so you aren't surprised when you get to your appointment.  2) Contact Your Local Health Department Not all health departments have doctors that can see patients for sick visits, but many do, so it is worth a call to see if yours does. If you don't know where your local health department is, you can  check in your phone book. The CDC also has a tool to help you locate your state's health department, and many state websites also have listings of all of their local health departments.  3) Find a [facility] If your illness is not likely to be very severe or complicated, you may want to try a walk in clinic. These are popping up all over the country in pharmacies, drugstores, and shopping centers. They're usually staffed by nurse practitioners or physician assistants that have been trained to treat common illnesses and complaints. They're usually fairly quick and inexpensive. However, if you have  serious medical issues or chronic medical problems, these are probably not your best option.  No Primary Care Doctor: Call [facility] Connect at  [phone] - they can help you locate a primary care doctor that  accepts your insurance, provides certain services, etc. Physician Referral Service- [phone]  Chronic Pain Problems: Organization         Address  Phone   Notes  [facility]  [phone] Patients need to be referred by their primary care doctor.   Medication Assistance: Organization         Address  Phone   Notes  [facility] Medication [facility] [address] [place], [place] [address] [phone] --Must be a resident of [facility] -- Must have NO insurance coverage whatsoever (no Medicaid/ Medicare, etc.) -- The pt. MUST have a primary care doctor that directs their care regularly and follows them in the community   [facility]  [phone]   [employer]  [phone]    Agencies that provide inexpensive medical care: Organization         Address  Phone   Notes  [place]  [phone]   [facility]    [phone]   [facility] [place],  [address] [phone]   [facility] [address], [place] [phone]   [facility]    [phone]   [facility]    [phone]   [place] [address], [place] Phone:  [phone], Fax:  [phone] Hours of Operation:  9 am - 6 pm, M-F.  Also accepts Medicaid/Medicare and self-pay.  [facility] for [address], [address], [place] Phone: [phone], Fax: [phone]. Hours of Operation:  8:30 am - 5:30 pm, M-F.  Also accepts Medicaid and self-pay.  [facility] [address], [place] Phone: [phone]   [place],  [place], [place] [phone], Ext. [phone] Mondays & Thursdays: 7-9 AM.  First 15 patients are seen on a first come, first serve basis.    [facility] Prov[facility]:  Organization         Address  Phone   Notes  [facility] [address], [place] [phone] Also accepts self-pay patients.  [facility] [address], [place]  [phone]   [place], [address], [place] [phone]   [facility] [address], [place] [phone]   Lucianne Lei [address], [place]   [phone] Only accepts [place] [facility] [place] patients after they have their name applied to their card.  Self-Pay (no insurance) in [facility]:  Organization         Address  Phone   Notes  Sickle Cell Patients, [facility] [place] [phone]   [facility] [place] [phone]   [facility] [place], [place], [place] [phone]   [facility]/Dr. Osei-Bonsu  [address], [place] or [address], [place] [phone] Phone number for both [place] and [place] locations is the same.  [facility] [address] [place] [phone]   [facility] [address] [place] or [address] [phone]   [facility] [address], [place] [phone], phone; [phone], fax Sees patients 1st and 3rd Saturday of every month.  Must not qualify for public or private insurance (i.e. Medicaid, Medicare, [place] Health Choice, Veterans' Benefits)  Household income should be no more than 200% of the poverty level The clinic cannot treat you if you are pregnant or think you are pregnant  Sexually  transmitted diseases are not treated at the clinic.

## 2018-09-01 DIAGNOSIS — S339XXA Sprain of unspecified parts of lumbar spine and pelvis, initial encounter: Secondary | ICD-10-CM | POA: Diagnosis not present

## 2018-09-06 ENCOUNTER — Encounter (HOSPITAL_COMMUNITY): Payer: Self-pay

## 2018-09-06 ENCOUNTER — Other Ambulatory Visit: Payer: Self-pay

## 2018-09-06 ENCOUNTER — Emergency Department (HOSPITAL_COMMUNITY)
Admission: EM | Admit: 2018-09-06 | Discharge: 2018-09-06 | Disposition: A | Discharge status: Home / Self Care | Payer: BLUE CROSS/BLUE SHIELD | Attending: Emergency Medicine | Admitting: Emergency Medicine

## 2018-09-06 DIAGNOSIS — R7303 Prediabetes: Secondary | ICD-10-CM | POA: Insufficient documentation

## 2018-09-06 DIAGNOSIS — M5431 Sciatica, right side: Secondary | ICD-10-CM | POA: Diagnosis present

## 2018-09-06 DIAGNOSIS — Z79899 Other long term (current) drug therapy: Secondary | ICD-10-CM | POA: Diagnosis not present

## 2018-09-06 DIAGNOSIS — M5441 Lumbago with sciatica, right side: Secondary | ICD-10-CM | POA: Diagnosis not present

## 2018-09-06 DIAGNOSIS — Z87891 Personal history of nicotine dependence: Secondary | ICD-10-CM | POA: Insufficient documentation

## 2018-09-06 DIAGNOSIS — J45909 Unspecified asthma, uncomplicated: Secondary | ICD-10-CM | POA: Diagnosis not present

## 2018-09-06 MED ORDER — CYCLOBENZAPRINE HCL 10 MG PO TABS: 10.0000 mg | ORAL_TABLET | Freq: Two times a day (BID) | ORAL | 0 refills | Status: DC | PRN

## 2018-09-06 MED ORDER — PREDNISONE 10 MG (21) PO TBPK: ORAL_TABLET | Freq: Every day | ORAL | 0 refills | Status: DC

## 2018-09-06 MED ORDER — KETOROLAC TROMETHAMINE 60 MG/2ML IM SOLN
60.0000 mg | Freq: Once | INTRAMUSCULAR | Status: AC
  Administered 2018-09-06: 60 mg via INTRAMUSCULAR
  Filled 2018-09-06: qty 2

## 2018-09-06 NOTE — ED Provider Notes (Signed)
[facility] DEPT Provider Note   CSN: [id number] Arrival date & time: 09/06/18  0940    History   Chief Complaint Chief Complaint  Patient presents with  . Sciatica    HPI Daniel Russell is a 48 y.o. male.     The history is provided by the patient.  Back Pain  Location:  Lumbar spine Quality:  Shooting Radiates to:  R thigh Pain severity:  Mild Onset quality:  Gradual Timing:  Intermittent Chronicity:  Recurrent Context: lifting heavy objects   Relieved by: NSAIDS, muscle relaxant. Worsened by:  Palpation and movement Associated symptoms: no abdominal pain, no abdominal swelling, no bladder incontinence, no bowel incontinence, no chest pain, no dysuria, no fever, no headaches, no leg pain, no numbness, no paresthesias, no pelvic pain, no perianal numbness, no tingling and no weakness     Past Medical History:  Diagnosis Date  . Asthma   . Back pain   . Heart murmur   . Lipoma of chest wall    left chest  . Pre-diabetes     Patient Active Problem List   Diagnosis Date Noted  . Abdominal pain, epigastric 04/15/2018  . Change in bowel habit 04/15/2018  . Constipation 04/15/2018    Past Surgical History:  Procedure Laterality Date  . KNEE ARTHROSCOPY    . LIPOMA EXCISION Right 06/14/2017   Procedure: EXCISION SUBCUTANEOUS LIPOMA-LEFT CHEST WALL;  Surgeon: Daniel Mesa, MD;  Location: [facility];  Service: General;  Laterality: Right;        Home Medications    Prior to Admission medications   Medication Sig Start Date End Date Taking? Authorizing Provider  albuterol (PROVENTIL HFA;VENTOLIN HFA) 108 (90 Base) MCG/ACT inhaler Inhale 1-2 puffs into the lungs every 6 (six) hours as needed for wheezing or shortness of breath. 12/27/17   Russell, Daniel R, PA-C  APPLE CIDER VINEGAR PO Take by mouth. Takes 2 gummies daily    [provider]  cyclobenzaprine (FLEXERIL) 10 MG tablet Take 1 tablet (10  mg total) by mouth 2 (two) times daily as needed for up to 15 doses for muscle spasms. 09/06/18   Daniel Fabela, DO  diclofenac (VOLTAREN) 25 MG EC tablet Take 25 mg by mouth 3 (three) times daily. 06/05/18   [provider]  methocarbamol (ROBAXIN) 500 MG tablet Take 1 tablet (500 mg total) by mouth 2 (two) times daily. 06/05/18   Russell, Daniel C, PA-C  omeprazole (PRILOSEC) 40 MG capsule Take 1 capsule (40 mg total) by mouth daily. 06/20/18   Russell, Daniel Nab., MD  polycarbophil (FIBERCON) 625 MG tablet Take 1 tablet (625 mg total) by mouth daily. 04/12/18   Russell, Daniel Nab., MD  predniSONE (STERAPRED UNI-PAK 21 TAB) 10 MG (21) TBPK tablet Take by mouth daily. Take 6 tabs by mouth daily  for 2 days, then 5 tabs for 2 days, then 4 tabs for 2 days, then 3 tabs for 2 days, 2 tabs for 2 days, then 1 tab by mouth daily for 2 days 09/06/18   Daniel Sites, DO  tiZANidine (ZANAFLEX) 2 MG tablet Take 1-2 tablets (2-4 mg total) by mouth at bedtime as needed for muscle spasms. 06/21/18   Daniel Russell, Daniel Como, MD    Family History Family History  Problem Relation Age of Onset  . Breast cancer Mother   . Hypertension Mother   . Lung cancer Father   . Diabetes Maternal Grandmother   . Diabetes Maternal Grandfather   .  Diabetes Paternal Grandmother   . Lung cancer Paternal Aunt   . Stomach cancer Paternal Uncle   . Colon cancer Neg Hx   . Esophageal cancer Neg Hx   . Inflammatory bowel disease Neg Hx   . Liver disease Neg Hx   . Pancreatic cancer Neg Hx   . Rectal cancer Neg Hx     Social History Social History   Tobacco Use  . Smoking status: Former Smoker    Packs/day: 0.50    Types: Cigarettes    Last attempt to quit: 04/12/2018    Years since quitting: 0.4  . Smokeless tobacco: Never Used  Substance Use Topics  . Alcohol use: No  . Drug use: No     Allergies   Patient has no known allergies.   Review of Systems Review of Systems  Constitutional: Negative for chills  and fever.  HENT: Negative for ear pain and sore throat.   Eyes: Negative for pain and visual disturbance.  Respiratory: Negative for cough and shortness of breath.   Cardiovascular: Negative for chest pain and palpitations.  Gastrointestinal: Negative for abdominal pain, bowel incontinence and vomiting.  Genitourinary: Negative for bladder incontinence, dysuria, hematuria and pelvic pain.  Musculoskeletal: Positive for back pain. Negative for arthralgias.  Skin: Negative for color change and rash.  Neurological: Positive for tremors. Negative for dizziness, tingling, seizures, syncope, facial asymmetry, speech difficulty, weakness, light-headedness, numbness, headaches and paresthesias.  All other systems reviewed and are negative.    Physical Exam Updated Vital Signs BP 129/81 (BP Location: Right Arm)   Pulse 72 Comment: Simultaneous filing. User may not have seen previous data.  Temp 98.3 F (36.8 C) (Oral)   Resp 16   Ht 5\' 10"  (1.778 m)   Wt 91 kg   SpO2 98% Comment: Simultaneous filing. User may not have seen previous data.  BMI 28.79 kg/m   Physical Exam Vitals signs and nursing note reviewed.  Constitutional:      Appearance: He is well-developed.  HENT:     Head: Normocephalic and atraumatic.  Eyes:     Extraocular Movements: Extraocular movements intact.     Conjunctiva/sclera: Conjunctivae normal.     Pupils: Pupils are equal, round, and reactive to light.  Neck:     Musculoskeletal: Neck supple.  Cardiovascular:     Rate and Rhythm: Normal rate and regular rhythm.     Pulses: Normal pulses.     Heart sounds: Normal heart sounds. No murmur.  Pulmonary:     Effort: Pulmonary effort is normal. No respiratory distress.     Breath sounds: Normal breath sounds.  Abdominal:     Palpations: Abdomen is soft.     Tenderness: There is no abdominal tenderness.  Musculoskeletal: Normal range of motion.        General: Tenderness present.     Comments: Tender to  paraspinal muscles of right lower lumbar spine, no midline spinal tenderness  Skin:    General: Skin is warm and dry.  Neurological:     General: No focal deficit present.     Mental Status: He is alert and oriented to person, place, and time.     Cranial Nerves: No cranial nerve deficit.     Sensory: No sensory deficit.     Motor: No weakness.     Coordination: Coordination normal.     Gait: Gait normal.     Comments: 5+ out of 5 strength throughout, normal sensation, antalgic gait  ED Treatments / Results  Labs (all labs ordered are listed, but only abnormal results are displayed) Labs Reviewed - No data to display  EKG None  Radiology No results found.  Procedures Procedures (including critical care time)  Medications Ordered in ED Medications  ketorolac (TORADOL) injection 60 mg (60 mg Intramuscular Given 09/06/18 1021)     Initial Impression / Assessment and Plan / ED Course  I have reviewed the triage vital signs and the nursing notes.  Pertinent labs & imaging results that were available during my care of the patient were reviewed by me and considered in my medical decision making (see chart for details).     Daniel Russell is a 48 year old male with no medical problems who presents to the ED with back pain.  Patient with normal vitals.  No fever.  No red flag signs of low back pain including no saddle anesthesia, no loss of bowel or bladder, no specific trauma history.  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  States that he had this issue several weeks ago that resolved with steroids and anti-inflammatories.  However, appears to have tightened up on him again last night at work.  Patient has normal neurological exam.  Has tenderness to the right sided paraspinal lumbar muscles consistent with muscle spasm.  Possible sciatica.  Given Toradol shot and will treat with prednisone.  Given additional doses of muscle  relaxant.  Given home exercises and discharged in ED in good condition.  Given return precautions.  This chart was dictated using voice recognition software.  Despite best efforts to proofread,  errors can occur which can change the documentation meaning.    Final Clinical Impressions(s) / ED Diagnoses   Final diagnoses:  Sciatica of right side    ED Discharge Orders         Ordered    cyclobenzaprine (FLEXERIL) 10 MG tablet  2 times daily PRN     09/06/18 1027    predniSONE (STERAPRED UNI-PAK 21 TAB) 10 MG (21) TBPK tablet  Daily     09/06/18 1027           Valma Russell, Daniel Skye, DO 09/06/18 1031

## 2018-09-06 NOTE — ED Triage Notes (Signed)
Pt states sciatica since last night. Hx of same.

## 2018-09-06 NOTE — ED Notes (Signed)
[place],MD at bedside.

## 2018-09-12 ENCOUNTER — Telehealth: Payer: Self-pay | Admitting: *Deleted

## 2018-09-12 NOTE — Telephone Encounter (Signed)
Ok to schedule ECL per Dr. Rush Landmark.  Spoke with pt and ECL scheduled for 09-28-18 at 1:30.  Pt states he has prep at home.  New instructions sent via mail.  Pt needs a doctor's note because he is scheduled to work Friday at 0100.  Noted included with instructions.

## 2018-09-26 ENCOUNTER — Telehealth: Payer: Self-pay

## 2018-09-26 NOTE — Telephone Encounter (Signed)
Covid-19 travel screening questions  Have you traveled in the last 14 days? No If yes where?  Do you now or have you had a fever in the last 14 days? No  Do you have any respiratory symptoms of shortness of breath or cough now or in the last 14 days? No  Do you have any family members or close contacts with diagnosed or suspected Covid-19? No       

## 2018-09-27 ENCOUNTER — Telehealth: Payer: Self-pay | Admitting: Gastroenterology

## 2018-09-27 NOTE — Telephone Encounter (Signed)
Attempted to call patient to answer questions. LMOM for patient to call me back.

## 2018-09-27 NOTE — Telephone Encounter (Signed)
Patient has questions regarding his procedure tomorrow with Dr. Rush Landmark

## 2018-09-27 NOTE — Telephone Encounter (Signed)
Patient called back to inquire about what foods he can eat today, the day prior to his procedure. Verified clear liquids only, no bread, no crackers that he specifically inquired of.

## 2018-09-28 ENCOUNTER — Ambulatory Visit (AMBULATORY_SURGERY_CENTER): Payer: BLUE CROSS/BLUE SHIELD | Admitting: Gastroenterology

## 2018-09-28 ENCOUNTER — Other Ambulatory Visit: Payer: Self-pay

## 2018-09-28 ENCOUNTER — Encounter: Payer: Self-pay | Admitting: Gastroenterology

## 2018-09-28 VITALS — BP 143/85 | HR 68 | Temp 98.2°F | Resp 13 | Ht 70.0 in | Wt 207.0 lb

## 2018-09-28 DIAGNOSIS — K2970 Gastritis, unspecified, without bleeding: Secondary | ICD-10-CM

## 2018-09-28 DIAGNOSIS — K648 Other hemorrhoids: Secondary | ICD-10-CM | POA: Diagnosis not present

## 2018-09-28 DIAGNOSIS — K259 Gastric ulcer, unspecified as acute or chronic, without hemorrhage or perforation: Secondary | ICD-10-CM

## 2018-09-28 DIAGNOSIS — K5730 Diverticulosis of large intestine without perforation or abscess without bleeding: Secondary | ICD-10-CM

## 2018-09-28 DIAGNOSIS — K449 Diaphragmatic hernia without obstruction or gangrene: Secondary | ICD-10-CM

## 2018-09-28 DIAGNOSIS — B9681 Helicobacter pylori [H. pylori] as the cause of diseases classified elsewhere: Secondary | ICD-10-CM

## 2018-09-28 DIAGNOSIS — R194 Change in bowel habit: Secondary | ICD-10-CM | POA: Diagnosis not present

## 2018-09-28 DIAGNOSIS — K2980 Duodenitis without bleeding: Secondary | ICD-10-CM

## 2018-09-28 DIAGNOSIS — D127 Benign neoplasm of rectosigmoid junction: Secondary | ICD-10-CM

## 2018-09-28 DIAGNOSIS — K635 Polyp of colon: Secondary | ICD-10-CM

## 2018-09-28 DIAGNOSIS — R1013 Epigastric pain: Secondary | ICD-10-CM

## 2018-09-28 DIAGNOSIS — K3189 Other diseases of stomach and duodenum: Secondary | ICD-10-CM | POA: Diagnosis not present

## 2018-09-28 DIAGNOSIS — D128 Benign neoplasm of rectum: Secondary | ICD-10-CM

## 2018-09-28 MED ORDER — OMEPRAZOLE 40 MG PO CPDR: 40.0000 mg | DELAYED_RELEASE_CAPSULE | Freq: Two times a day (BID) | ORAL | 3 refills | Status: DC

## 2018-09-28 MED ORDER — POLYETHYLENE GLYCOL 3350 17 GM/SCOOP PO POWD: 1.0000 | Freq: Three times a day (TID) | ORAL | 3 refills | Status: DC

## 2018-09-28 MED ORDER — FLEET ENEMA 7-19 GM/118ML RE ENEM
1.0000 | ENEMA | Freq: Once | RECTAL | Status: AC
  Administered 2018-09-28: 1 via RECTAL

## 2018-09-28 MED ORDER — CALCIUM POLYCARBOPHIL 625 MG PO TABS: 1250.0000 mg | ORAL_TABLET | Freq: Every day | ORAL | Status: DC

## 2018-09-28 MED ORDER — DOCUSATE SODIUM 100 MG PO CAPS: 100.0000 mg | ORAL_CAPSULE | Freq: Two times a day (BID) | ORAL | 0 refills | Status: DC

## 2018-09-28 MED ORDER — SODIUM CHLORIDE 0.9 % IV SOLN: 500.0000 mL | Freq: Once | INTRAVENOUS | Status: DC

## 2018-09-28 NOTE — Progress Notes (Signed)
Called to room to assist during endoscopic procedure.  Patient ID and intended procedure confirmed with present staff. Received instructions for my participation in the procedure from the performing physician.  

## 2018-09-28 NOTE — Op Note (Signed)
[facility] Patient Name: Daniel Russell Procedure Date: 09/28/2018 1:37 PM MRN: [id number] Endoscopist: Justice Britain , MD Age: 48 Referring MD:  Date of Birth: [birth year] Gender: Male Account #: [id number] Procedure:                Colonoscopy Indications:              This is the patient's first colonoscopy, Change in                            bowel habits, Constipation Medicines:                Monitored Anesthesia Care Procedure:                Pre-Anesthesia Assessment:                           - Prior to the procedure, a History and Physical                            was performed, and patient medications and                            allergies were reviewed. The patient's tolerance of                            previous anesthesia was also reviewed. The risks                            and benefits of the procedure and the sedation                            options and risks were discussed with the patient.                            All questions were answered, and informed consent                            was obtained. Prior Anticoagulants: The patient has                            taken no previous anticoagulant or antiplatelet                            agents except for NSAID medication. ASA Grade                            Assessment: II - A patient with mild systemic                            disease. After reviewing the risks and benefits,                            the patient was deemed in satisfactory condition to  undergo the procedure.                           After obtaining informed consent, the colonoscope                            was passed under direct vision. Throughout the                            procedure, the patient's blood pressure, pulse, and                            oxygen saturations were monitored continuously. The                            Colonoscope was introduced through the anus and                             advanced to the 5 cm into the ileum. The                            colonoscopy was performed without difficulty. The                            patient tolerated the procedure. The quality of the                            bowel preparation was good. The terminal ileum,                            ileocecal valve, appendiceal orifice, and rectum                            were photographed. Scope In: 2:08:38 PM Scope Out: 2:23:38 PM Scope Withdrawal Time: 0 hours 12 minutes 23 seconds  Total Procedure Duration: 0 hours 15 minutes 0 seconds  Findings:                 The digital rectal exam findings include                            non-thrombosed internal hemorrhoids. Pertinent                            negatives include no palpable rectal lesions.                           The terminal ileum and ileocecal valve appeared                            normal.                           Two sessile polyps were found in the recto-sigmoid  colon and sigmoid colon. The polyps were 3 to 4 mm                            in size. These polyps were removed with a cold                            snare. Resection and retrieval were complete.                           Multiple small-mouthed diverticula were found in                            the recto-sigmoid colon, sigmoid colon, descending                            colon and ascending colon.                           Normal mucosa was found in the entire colon                            otherwise.                           Non-bleeding non-thrombosed internal hemorrhoids                            were found during retroflexion, during perianal                            exam and during digital exam. The hemorrhoids were                            Grade II (internal hemorrhoids that prolapse but                            reduce spontaneously). Complications:            No immediate  complications. Estimated Blood Loss:     Estimated blood loss was minimal. Impression:               - Non-thrombosed internal hemorrhoids found on                            digital rectal exam.                           - The examined portion of the ileum was normal.                           - Two 3 to 4 mm polyps at the recto-sigmoid colon                            and in the sigmoid colon, removed with a cold  snare. Resected and retrieved.                           - Diverticulosis in the recto-sigmoid colon, in the                            sigmoid colon, in the descending colon and in the                            ascending colon.                           - Normal mucosa in the entire examined colon.                           - Non-bleeding non-thrombosed internal hemorrhoids. Recommendation:           - The patient will be observed post-procedure,                            until all discharge criteria are met.                           - Discharge patient to home.                           - Patient has a contact number available for                            emergencies. The signs and symptoms of potential                            delayed complications were discussed with the                            patient. Return to normal activities tomorrow.                            Written discharge instructions were provided to the                            patient.                           - High fiber diet.                           - Continue present medications.                           - Use FiberCon 2 tablets PO daily.                           - Colace capsule(s) orally 100 mg BID.                           - While  bowel is completely clean at this time I                            would proceed with restarting Miralax and use it 3                            times daily. Will consider the use of increased                            dose  Linzess 290 vs Trulance vs Amitiza as                            medications that could be trialed. I will send the                            patient downstairs to be given 3 weeks of Linzess                            290 mcg samples to trial.                           - Follow up in 6-weeks in clinic.                           - Await pathology results.                           - Repeat colonoscopy 09/13/08 years for surveillance                            based on pathology results and findings of                            adenomatous tissue.                           - The findings and recommendations were discussed                            with the patient. Justice Britain, MD 09/28/2018 2:42:49 PM

## 2018-09-28 NOTE — Op Note (Signed)
[facility] Patient Name: Daniel Russell Procedure Date: 09/28/2018 1:38 PM MRN: [id number] Endoscopist: Justice Britain , MD Age: 48 Referring MD:  Date of Birth: [birth year] Gender: Male Account #: [id number] Procedure:                Upper GI endoscopy Indications:              Epigastric abdominal pain, Dyspepsia, Dysphagia                            (improved currently), Heartburn Medicines:                Monitored Anesthesia Care Procedure:                Pre-Anesthesia Assessment:                           - Prior to the procedure, a History and Physical                            was performed, and patient medications and                            allergies were reviewed. The patient's tolerance of                            previous anesthesia was also reviewed. The risks                            and benefits of the procedure and the sedation                            options and risks were discussed with the patient.                            All questions were answered, and informed consent                            was obtained. Prior Anticoagulants: The patient has                            taken no previous anticoagulant or antiplatelet                            agents except for aspirin. ASA Grade Assessment: II                            - A patient with mild systemic disease. After                            reviewing the risks and benefits, the patient was                            deemed in satisfactory condition to undergo the  procedure.                           After obtaining informed consent, the endoscope was                            passed under direct vision. Throughout the                            procedure, the patient's blood pressure, pulse, and                            oxygen saturations were monitored continuously. The                            Endoscope was introduced through the mouth, and                            advanced to the second part of duodenum. The upper                            GI endoscopy was accomplished without difficulty.                            The patient tolerated the procedure. Scope In: Scope Out: Findings:                 No gross lesions were noted in the entire                            esophagus. Biopsies were taken with a cold forceps                            for histology to rule out EoE.                           The Z-line was regular and was found 41 cm from the                            incisors.                           A small hiatal hernia was present.                           Multiple dispersed, small non-bleeding erosions                            were found in the gastric body, in the gastric                            antrum and in the prepyloric region of the stomach.                           Patchy moderately erythematous mucosa was found in  the entire examined stomach.                           No other gross lesions were noted in the entire                            examined stomach. Biopsies were taken with a cold                            forceps for histology and Helicobacter pylori                            testing.                           Multiple dispersed erosions without bleeding were                            found in the duodenal bulb and in the first portion                            of the duodenum.                           No gross lesions were noted in the second portion                            of the duodenum.                           Biopsies were taken with a cold forceps in the                            duodenal bulb, in the first portion of the duodenum                            and in the second portion of the duodenum for                            histology to rule out enteropathy. Complications:            No immediate complications. Estimated Blood Loss:      Estimated blood loss was minimal. Impression:               - No gross lesions in esophagus. Biopsied for EoE.                           - Z-line regular, 41 cm from the incisors.                           - Small hiatal hernia.                           - Non-bleeding erosive gastropathy. Erythematous  mucosa in the stomach. Biopsied for HP.                           - Duodenal erosions without bleeding in Bulb &                            D1/D2. No other gross lesions in the second portion                            of the duodenum. Biopsies were taken with a cold                            forceps for histology in the duodenal bulb, in the                            first portion of the duodenum and in the second                            portion of the duodenum. Recommendation:           - Proceed to scheduled colonoscopy.                           - Await pathology results.                           - Minimize NSAIDs (Diclofenec) as able.                           - Increase to 40 mg BID Omeprazole x69-months and                            then decrease to once daily thereafter if symptoms                            are improved/stable and if able to be off or                            minimizing NSAIDs.                           - The findings and recommendations were discussed                            with the patient. Justice Britain, MD 09/28/2018 2:35:25 PM

## 2018-09-28 NOTE — Progress Notes (Signed)
A/ox3, pleased with MAC, report to RN 

## 2018-09-28 NOTE — Patient Instructions (Signed)
YOU HAD AN ENDOSCOPIC PROCEDURE TODAY AT [facility]:   Refer to the procedure report that was given to you for any specific questions about what was found during the examination.  If the procedure report does not answer your questions, please call your gastroenterologist to clarify.  If you requested that your care partner not be given the details of your procedure findings, then the procedure report has been included in a sealed envelope for you to review at your convenience later.   **Handouts given on High fiber diet, hemorrhoids, diverticulosis and Gastritis**     YOU SHOULD EXPECT: Some feelings of bloating in the abdomen. Passage of more gas than usual.  Walking can help get rid of the air that was put into your GI tract during the procedure and reduce the bloating. If you had a lower endoscopy (such as a colonoscopy or flexible sigmoidoscopy) you may notice spotting of blood in your stool or on the toilet paper. If you underwent a bowel prep for your procedure, you may not have a normal bowel movement for a few days.  Please Note:  You might notice some irritation and congestion in your nose or some drainage.  This is from the oxygen used during your procedure.  There is no need for concern and it should clear up in a day or so.  SYMPTOMS TO REPORT IMMEDIATELY:   Following lower endoscopy (colonoscopy or flexible sigmoidoscopy):  Excessive amounts of blood in the stool  Significant tenderness or worsening of abdominal pains  Swelling of the abdomen that is new, acute  Fever of 100F or higher   Following upper endoscopy (EGD)  Vomiting of blood or coffee ground material  New chest pain or pain under the shoulder blades  Painful or persistently difficult swallowing  New shortness of breath  Fever of 100F or higher  Black, tarry-looking stools  For urgent or emergent issues, a gastroenterologist can be reached at any hour by calling [phone].   DIET:  We  do recommend a small meal at first, but then you may proceed to your regular diet.  Drink plenty of fluids but you should avoid alcoholic beverages for 24 hours.  ACTIVITY:  You should plan to take it easy for the rest of today and you should NOT DRIVE or use heavy machinery until tomorrow (because of the sedation medicines used during the test).    FOLLOW UP: Our staff will call the number listed on your records 48-72 hours following your procedure to check on you and address any questions or concerns that you may have regarding the information given to you following your procedure. If we do not reach you, we will leave a message.  We will attempt to reach you two times.  During this call, we will ask if you have developed any symptoms of COVID 19. If you develop any symptoms (for example fever, flu-like symptoms, shortness of breath, cough etc.) before then, please call [phone].  If any biopsies were taken you will be contacted by phone or by letter within the next 1-3 weeks.  Please call us at [phone] if you have not heard about the biopsies in 3 weeks.    SIGNATURES/CONFIDENTIALITY: You and/or your care partner have signed paperwork which will be entered into your electronic medical record.  These signatures attest to the fact that that the information above on your After Visit Summary has been reviewed and is understood.  Full responsibility of the confidentiality of  this discharge information lies with you and/or your care-partner.

## 2018-09-28 NOTE — Progress Notes (Signed)
Pt reports last bm at 1015 am was cloudy brown, states he has been very constipated for a long time.  Dr Early Osmond notified, order for fleets enema, pt instructed and administers in bathroom.  1330-pt has light yellow result after enema.

## 2018-09-28 NOTE — Progress Notes (Signed)
CW temp JB vital signs

## 2018-09-30 ENCOUNTER — Telehealth: Payer: Self-pay

## 2018-09-30 NOTE — Telephone Encounter (Signed)
  Follow up Call-  Call back number 09/28/2018  Post procedure Call Back phone  # [phone]  Permission to leave phone message Yes     Left message

## 2018-10-01 ENCOUNTER — Telehealth: Payer: Self-pay

## 2018-10-01 NOTE — Telephone Encounter (Signed)
  Follow up Call-  Call back number 09/28/2018  Post procedure Call Back phone  # [phone]  Permission to leave phone message Yes     Patient questions:  Do you have a fever, pain , or abdominal swelling? No. Pain Score  0 *  Have you tolerated food without any problems? Yes.    Have you been able to return to your normal activities? Yes.    Do you have any questions about your discharge instructions: Diet   No. Medications  No. Follow up visit  No.  Do you have questions or concerns about your Care? No.  Actions: * If pain score is 4 or above: No action needed, pain <4.   1. Have you developed a fever since your procedure? No.  2.   Have you had an respiratory symptoms (SOB or cough) since your procedure? No.  3.   Have you tested positive for COVID 19 since your procedure No.  4.   Have you had any family members/close contacts diagnosed with the COVID 19 since your procedure?  No.   If any of these questions are a yes, please inquire if patient has been seen by family doctor and route this note to Joylene John, Therapist, sports.

## 2018-10-03 ENCOUNTER — Encounter: Payer: Self-pay | Admitting: Gastroenterology

## 2018-10-04 ENCOUNTER — Other Ambulatory Visit: Payer: Self-pay

## 2018-10-04 MED ORDER — OMEPRAZOLE 40 MG PO CPDR: 40.0000 mg | DELAYED_RELEASE_CAPSULE | Freq: Two times a day (BID) | ORAL | 3 refills | Status: DC

## 2018-10-04 MED ORDER — BIS SUBCIT-METRONID-TETRACYC 140-125-125 MG PO CAPS: 3.0000 | ORAL_CAPSULE | Freq: Three times a day (TID) | ORAL | 0 refills | Status: DC

## 2018-10-05 ENCOUNTER — Telehealth: Payer: Self-pay | Admitting: *Deleted

## 2018-10-05 ENCOUNTER — Telehealth: Payer: Self-pay

## 2018-10-05 ENCOUNTER — Telehealth: Payer: Self-pay | Admitting: Gastroenterology

## 2018-10-05 MED ORDER — DOXYCYCLINE HYCLATE 100 MG PO TABS: 100.0000 mg | ORAL_TABLET | Freq: Two times a day (BID) | ORAL | 0 refills | Status: AC

## 2018-10-05 MED ORDER — BISMUTH SUBSALICYLATE 262 MG PO CHEW: 524.0000 mg | CHEWABLE_TABLET | Freq: Four times a day (QID) | ORAL | 0 refills | Status: AC

## 2018-10-05 MED ORDER — OMEPRAZOLE 20 MG PO CPDR: 20.0000 mg | DELAYED_RELEASE_CAPSULE | Freq: Two times a day (BID) | ORAL | 0 refills | Status: DC

## 2018-10-05 MED ORDER — METRONIDAZOLE 250 MG PO TABS: 250.0000 mg | ORAL_TABLET | Freq: Three times a day (TID) | ORAL | 0 refills | Status: AC

## 2018-10-05 NOTE — Telephone Encounter (Signed)
Alternative medications have been sent to [facility]. Left voicemail for pt asking for return call.

## 2018-10-05 NOTE — Telephone Encounter (Signed)
See telephone note.

## 2018-10-05 NOTE — Telephone Encounter (Signed)
Patient Called in wanting samples of Linzess. I checked and we are out. Called patient back and left message stating that;  and if he want a script called into his pharmacy for it to let us know and I would ask Dr. Rush Landmark

## 2018-10-05 NOTE — Telephone Encounter (Addendum)
Dr. Rush Landmark I sent in alternative medications for Pylera because it was too expensive for patient. See medication list. I have called and left msg for patient to give him clarification on how to continue his omeprazole after 14 day treatment.

## 2018-10-05 NOTE — Telephone Encounter (Signed)
Thank you for update. Agree with changing to the generic medication format. Thanks. GM

## 2018-10-05 NOTE — Telephone Encounter (Signed)
Patient had a procedure on 5/21 and was supposed to have been given samples of Linzess .He was told that we didn't have any at that time so he is calling back to see if he can pick up.  Also spoke with him about the results from his procedure and explained h.pylori and pylera.

## 2018-11-01 ENCOUNTER — Telehealth: Payer: Self-pay

## 2018-11-01 NOTE — Telephone Encounter (Signed)
-----   Message from Hughie Closs, RN sent at 11/01/2018  8:30 AM EDT -----  ----- Message ----- From: Hughie Closs, RN Sent: 11/01/2018 To: Hughie Closs, RN  Schedule clinic visit with Dr. Rush Landmark around 11/15/18 - 4 weeks after done with H Pylori tx

## 2018-11-01 NOTE — Telephone Encounter (Signed)
Schedule not out yet. Pt notified to call and set up appt at least 1 month after stopping PPI

## 2018-11-18 ENCOUNTER — Emergency Department (HOSPITAL_COMMUNITY)
Admission: EM | Admit: 2018-11-18 | Discharge: 2018-11-18 | Disposition: A | Discharge status: Home / Self Care | Payer: BC Managed Care – PPO | Attending: Emergency Medicine | Admitting: Emergency Medicine

## 2018-11-18 ENCOUNTER — Encounter (HOSPITAL_COMMUNITY): Payer: Self-pay | Admitting: *Deleted

## 2018-11-18 ENCOUNTER — Other Ambulatory Visit: Payer: Self-pay

## 2018-11-18 DIAGNOSIS — J45909 Unspecified asthma, uncomplicated: Secondary | ICD-10-CM | POA: Diagnosis not present

## 2018-11-18 DIAGNOSIS — F17210 Nicotine dependence, cigarettes, uncomplicated: Secondary | ICD-10-CM | POA: Insufficient documentation

## 2018-11-18 DIAGNOSIS — R29810 Facial weakness: Secondary | ICD-10-CM

## 2018-11-18 DIAGNOSIS — Z79899 Other long term (current) drug therapy: Secondary | ICD-10-CM | POA: Insufficient documentation

## 2018-11-18 DIAGNOSIS — G510 Bell's palsy: Secondary | ICD-10-CM | POA: Diagnosis not present

## 2018-11-18 HISTORY — DX: Gastrojejunal ulcer, unspecified as acute or chronic, without hemorrhage or perforation: K28.9

## 2018-11-18 LAB — CBC WITH DIFFERENTIAL/PLATELET
Abs Immature Granulocytes: 0.04 10*3/uL (ref 0.00–0.07)
Basophils Absolute: 0 10*3/uL (ref 0.0–0.1)
Basophils Relative: 0 %
Eosinophils Absolute: 0 10*3/uL (ref 0.0–0.5)
Eosinophils Relative: 0 %
HCT: 50.2 % (ref 39.0–52.0)
Hemoglobin: 16.7 g/dL (ref 13.0–17.0)
Immature Granulocytes: 0 %
Lymphocytes Relative: 17 %
Lymphs Abs: 1.9 10*3/uL (ref 0.7–4.0)
MCH: 31.9 pg (ref 26.0–34.0)
MCHC: 33.3 g/dL (ref 30.0–36.0)
MCV: 96 fL (ref 80.0–100.0)
Monocytes Absolute: 0.6 10*3/uL (ref 0.1–1.0)
Monocytes Relative: 6 %
Neutro Abs: 8.3 10*3/uL — ABNORMAL HIGH (ref 1.7–7.7)
Neutrophils Relative %: 77 %
Platelets: 229 10*3/uL (ref 150–400)
RBC: 5.23 MIL/uL (ref 4.22–5.81)
RDW: 14 % (ref 11.5–15.5)
WBC: 10.9 10*3/uL — ABNORMAL HIGH (ref 4.0–10.5)
nRBC: 0 % (ref 0.0–0.2)

## 2018-11-18 LAB — COMPREHENSIVE METABOLIC PANEL
ALT: 29 U/L (ref 0–44)
AST: 24 U/L (ref 15–41)
Albumin: 4.9 g/dL (ref 3.5–5.0)
Alkaline Phosphatase: 89 U/L (ref 38–126)
Anion gap: 10 (ref 5–15)
BUN: 12 mg/dL (ref 6–20)
CO2: 28 mmol/L (ref 22–32)
Calcium: 9.9 mg/dL (ref 8.9–10.3)
Chloride: 103 mmol/L (ref 98–111)
Creatinine, Ser: 1.01 mg/dL (ref 0.61–1.24)
GFR calc Af Amer: >60 mL/min (ref >60)
GFR calc non Af Amer: >60 mL/min (ref >60)
Glucose, Bld: 86 mg/dL (ref 70–99)
Potassium: 3.4 mmol/L — ABNORMAL LOW (ref 3.5–5.1)
Sodium: 141 mmol/L (ref 135–145)
Total Bilirubin: 0.5 mg/dL (ref 0.3–1.2)
Total Protein: 8.5 g/dL — ABNORMAL HIGH (ref 6.5–8.1)

## 2018-11-18 MED ORDER — PREDNISONE 20 MG PO TABS: 60.0000 mg | ORAL_TABLET | Freq: Every day | ORAL | 0 refills | Status: AC

## 2018-11-18 MED ORDER — VALACYCLOVIR HCL 1 G PO TABS: 1000.0000 mg | ORAL_TABLET | Freq: Three times a day (TID) | ORAL | 0 refills | Status: DC

## 2018-11-18 NOTE — ED Triage Notes (Signed)
Pt states last night he had numbness on rt side of face, This morning woke and has weakness on left side of face, can not close left eye or smile on left side, No other symptoms.

## 2018-11-18 NOTE — Discharge Instructions (Signed)
I have given you a prescription for steroids today.  Some common side effects include feelings of extra energy, feeling warm, increased appetite, and stomach upset.  If you are diabetic your sugars may run higher than usual.   As we discussed it is important that you keep your eyes shut at night to help avoid getting corneal (eye) ulcers.  When you pick up your prescriptions please get an eyedrop.  I would recommend using the eyedrops once every hour while you are awake.  Please look for an eyedrop with the least amount of ingredients, the simpler the better.  You can get any kind of eyedrop.   look for something like Refresh Liqui-Gel or another simple drop rather than one with many ingredients.

## 2018-11-18 NOTE — ED Provider Notes (Signed)
[facility] DEPT Provider Note   CSN: [id number] Arrival date & time: 11/18/18  9528    History   Chief Complaint Chief Complaint  Patient presents with  . Facial Droop    HPI Daniel Russell is a 48 y.o. male with a past medical history of chronic back pain, prediabetes, who presents today for evaluation of facial droop.  He reports that yesterday he was very sleepy and slept more than usual however otherwise felt okay.  Today when he woke up he noted drooping on the left side of his face.  He states that he could not hold water in his mouth when he brushed his teeth or tried to drink.  He initially went to work and states that he tried to hide it with his mask from his coworkers, however they noticed and sent him to the emergency room.  He denies any recent trauma or injuries.  No recent fevers, sickness, or illness.  He states that he has never had anything before.    He denies any recent chiropractic adjustments.  No headache.      HPI  Past Medical History:  Diagnosis Date  . Asthma    as a child  . Back pain   . Heart murmur   . Lipoma of chest wall    left chest  . Pre-diabetes   . Ulcer of the stomach and intestine     Patient Active Problem List   Diagnosis Date Noted  . Abdominal pain, epigastric 04/15/2018  . Change in bowel habit 04/15/2018  . Constipation 04/15/2018    Past Surgical History:  Procedure Laterality Date  . KNEE ARTHROSCOPY    . LIPOMA EXCISION Right 06/14/2017   Procedure: EXCISION SUBCUTANEOUS LIPOMA-LEFT CHEST WALL;  Surgeon: Donnie Mesa, MD;  Location: [facility];  Service: General;  Laterality: Right;        Home Medications    Prior to Admission medications   Medication Sig Start Date End Date Taking? Authorizing Provider  albuterol (PROVENTIL HFA;VENTOLIN HFA) 108 (90 Base) MCG/ACT inhaler Inhale 1-2 puffs into the lungs every 6 (six) hours as needed for wheezing or shortness of  breath. 12/27/17   Petrucelli, Samantha R, PA-C  APPLE CIDER VINEGAR PO Take by mouth. Takes 2 gummies daily    [provider]  cyclobenzaprine (FLEXERIL) 10 MG tablet Take 1 tablet (10 mg total) by mouth 2 (two) times daily as needed for up to 15 doses for muscle spasms. 09/06/18   Curatolo, Adam, DO  diclofenac (VOLTAREN) 25 MG EC tablet Take 25 mg by mouth 3 (three) times daily. 06/05/18   [provider]  docusate sodium (COLACE) 100 MG capsule Take 1 capsule (100 mg total) by mouth 2 (two) times daily. 09/28/18   Mansouraty, Telford Nab., MD  omeprazole (PRILOSEC) 20 MG capsule Take 1 capsule (20 mg total) by mouth 2 (two) times a day for 14 days. 10/05/18 10/19/18  Mansouraty, Telford Nab., MD  omeprazole (PRILOSEC) 40 MG capsule Take 1 capsule (40 mg total) by mouth 2 (two) times a day. Patient taking differently: Take 40 mg by mouth 2 (two) times a day. Pt will start 40mg  twice daily, after completion of Pylera treatment. 09/28/18   Mansouraty, Telford Nab., MD  polycarbophil (FIBERCON) 625 MG tablet Take 2 tablets (1,250 mg total) by mouth daily. 09/28/18   Mansouraty, Telford Nab., MD  polyethylene glycol powder (GLYCOLAX/MIRALAX) 17 GM/SCOOP powder Take 255 g by mouth 3 (three) times daily.  09/28/18   Mansouraty, Telford Nab., MD  predniSONE (DELTASONE) 20 MG tablet Take 3 tablets (60 mg total) by mouth daily for 5 days. 11/18/18 11/23/18  Lorin Glass, PA-C  tiZANidine (ZANAFLEX) 2 MG tablet Take 1-2 tablets (2-4 mg total) by mouth at bedtime as needed for muscle spasms. Patient not taking: Reported on 09/28/2018 06/21/18   Hilts, Legrand Como, MD  valACYclovir (VALTREX) 1000 MG tablet Take 1 tablet (1,000 mg total) by mouth 3 (three) times daily. 11/18/18   Lorin Glass, PA-C    Family History Family History  Problem Relation Age of Onset  . Breast cancer Mother   . Hypertension Mother   . Lung cancer Father   . Diabetes Maternal Grandmother   . Diabetes Maternal  Grandfather   . Diabetes Paternal Grandmother   . Lung cancer Paternal Aunt   . Stomach cancer Paternal Uncle   . Colon cancer Neg Hx   . Esophageal cancer Neg Hx   . Inflammatory bowel disease Neg Hx   . Liver disease Neg Hx   . Pancreatic cancer Neg Hx   . Rectal cancer Neg Hx   . Colon polyps Neg Hx     Social History Social History   Tobacco Use  . Smoking status: Current Every Day Smoker    Packs/day: 0.50    Types: Cigarettes  . Smokeless tobacco: Never Used  Substance Use Topics  . Alcohol use: No  . Drug use: No     Allergies   Patient has no known allergies.   Review of Systems Review of Systems  Constitutional: Negative for chills and fever.  HENT: Negative for congestion, ear pain and postnasal drip.   Eyes: Negative for visual disturbance.  Respiratory: Negative for cough and shortness of breath.   Cardiovascular: Negative for chest pain and palpitations.  Gastrointestinal: Negative for abdominal pain.  Musculoskeletal: Negative for back pain, neck pain and neck stiffness.  Skin: Negative for color change.  Neurological: Positive for facial asymmetry and weakness (Facial only). Negative for speech difficulty and headaches.  Psychiatric/Behavioral: Negative for confusion.  All other systems reviewed and are negative.    Physical Exam Updated Vital Signs BP 128/81   Pulse 63   Temp 98.5 F (36.9 C) (Oral)   Resp (!) 22   Ht 5\' 10"  (1.778 m)   Wt 93.2 kg   SpO2 99%   BMI 29.48 kg/m   Physical Exam Vitals signs and nursing note reviewed.  Constitutional:      General: He is not in acute distress.    Appearance: He is well-developed. He is not diaphoretic.  HENT:     Head:     Comments: Left sided facial droop.  Please see neuro section    Right Ear: Tympanic membrane and ear canal normal.     Left Ear: Tympanic membrane and ear canal normal.     Nose: Nose normal.     Mouth/Throat:     Mouth: Mucous membranes are moist.     Pharynx: No  oropharyngeal exudate or posterior oropharyngeal erythema.  Eyes:     General: No scleral icterus.       Right eye: No discharge.        Left eye: No discharge.     Extraocular Movements: Extraocular movements intact.     Conjunctiva/sclera: Conjunctivae normal.     Pupils: Pupils are equal, round, and reactive to light.  Neck:     Musculoskeletal: Normal range of motion and neck  supple. No neck rigidity.  Cardiovascular:     Rate and Rhythm: Normal rate and regular rhythm.     Pulses: Normal pulses.     Heart sounds: Normal heart sounds.  Pulmonary:     Effort: Pulmonary effort is normal. No respiratory distress.     Breath sounds: No stridor.  Abdominal:     General: Abdomen is flat. Bowel sounds are normal. There is no distension.  Musculoskeletal: Normal range of motion.        General: No deformity.     Right lower leg: No edema.     Left lower leg: No edema.  Skin:    General: Skin is warm and dry.  Neurological:     Mental Status: He is alert.     GCS: GCS eye subscore is 4. GCS verbal subscore is 5. GCS motor subscore is 6.     Motor: No abnormal muscle tone.     Comments: There is a generalized left-sided facial droop.  He is unable to raise his left-sided eyebrow, close his left eye tightly or smile with the left side of his face.  Right side facial movements are intact.  He has left sided nasolabial crease flattening.  He has 5/5 strength in bilateral upper and lower extremities through major muscle groups including grip strength and dorsiflexion/plantarflexion bilaterally.  Gait is normal, he is able to stand without losing balance.  No pronator drift.  Normal finger-nose-finger, rapid alternating movements bilaterally.  Psychiatric:        Behavior: Behavior normal.      ED Treatments / Results  Labs (all labs ordered are listed, but only abnormal results are displayed) Labs Reviewed  COMPREHENSIVE METABOLIC PANEL - Abnormal; Notable for the following components:       Result Value   Potassium 3.4 (*)    Total Protein 8.5 (*)    All other components within normal limits  CBC WITH DIFFERENTIAL/PLATELET - Abnormal; Notable for the following components:   WBC 10.9 (*)    Neutro Abs 8.3 (*)    All other components within normal limits    EKG EKG Interpretation  Date/Time:  Saturday November 18 2018 08:44:36 EDT Ventricular Rate:  66 PR Interval:    QRS Duration: 85 QT Interval:  370 QTC Calculation: 388 R Axis:   40 Text Interpretation:  Sinus rhythm Atrial premature complex Probable left atrial enlargement ST-t wave abnormality Abnormal ECG Confirmed by Carmin Muskrat [phone]) on 11/18/2018 9:17:51 AM   Radiology No results found.  Procedures Procedures (including critical care time)  Medications Ordered in ED Medications - No data to display   Initial Impression / Assessment and Plan / ED Course  I have reviewed the triage vital signs and the nursing notes.  Pertinent labs & imaging results that were available during my care of the patient were reviewed by me and considered in my medical decision making (see chart for details).       Patient presents today for evaluation of right-sided facial weakness since he woke up this morning.  He on exam has weakness to the left side of his face.  He is unable to raise his eyebrows.  He has general drooping to the entire right side of his face.  He denies any recent trauma or injury, has not had any recent chiropractic adjustments.  Reports that yesterday he was tired however has otherwise been well recently.  Labs obtained and reviewed, he has a very mild leukocytosis, however has full,  active pain-free range of motion of his neck without rigidity.  Mild hypokalemia at 3.4.  As he is unable to raise the eyebrow on the left side of his face I suspect Bell's palsy based on clinical picture.  This patient was seen as a shared visit with Dr. Vanita Panda who also evaluated patient.  He is given prescriptions  for prednisone and Valtrex.  He denied any other complaints or concerns.  No headache.    Recommended PCP follow-up.  Return precautions were discussed with patient who states their understanding.  At the time of discharge patient denied any unaddressed complaints or concerns.  Patient is agreeable for discharge home.    Final Clinical Impressions(s) / ED Diagnoses   Final diagnoses:  Bell's palsy  Facial droop    ED Discharge Orders         Ordered    predniSONE (DELTASONE) 20 MG tablet  Daily     11/18/18 1007    valACYclovir (VALTREX) 1000 MG tablet  3 times daily     11/18/18 1007           Lorin Glass, [place] 11/18/18 1250    Carmin Muskrat, MD 11/19/18 1600

## 2018-11-25 ENCOUNTER — Encounter (HOSPITAL_COMMUNITY): Payer: Self-pay | Admitting: Emergency Medicine

## 2018-11-25 ENCOUNTER — Other Ambulatory Visit: Payer: Self-pay

## 2018-11-25 ENCOUNTER — Emergency Department (HOSPITAL_COMMUNITY)
Admission: EM | Admit: 2018-11-25 | Discharge: 2018-11-25 | Disposition: A | Discharge status: Home / Self Care | Payer: BC Managed Care – PPO | Attending: Emergency Medicine | Admitting: Emergency Medicine

## 2018-11-25 DIAGNOSIS — R519 Headache, unspecified: Secondary | ICD-10-CM

## 2018-11-25 DIAGNOSIS — G510 Bell's palsy: Secondary | ICD-10-CM | POA: Insufficient documentation

## 2018-11-25 DIAGNOSIS — F17210 Nicotine dependence, cigarettes, uncomplicated: Secondary | ICD-10-CM | POA: Diagnosis not present

## 2018-11-25 DIAGNOSIS — R51 Headache: Secondary | ICD-10-CM | POA: Insufficient documentation

## 2018-11-25 DIAGNOSIS — J45909 Unspecified asthma, uncomplicated: Secondary | ICD-10-CM | POA: Insufficient documentation

## 2018-11-25 MED ORDER — HYDROCODONE-ACETAMINOPHEN 5-325 MG PO TABS: 1.0000 | ORAL_TABLET | Freq: Four times a day (QID) | ORAL | 0 refills | Status: DC | PRN

## 2018-11-25 MED ORDER — HYDROCODONE-ACETAMINOPHEN 5-325 MG PO TABS
1.0000 | ORAL_TABLET | Freq: Once | ORAL | Status: AC
  Administered 2018-11-25: 1 via ORAL
  Filled 2018-11-25: qty 1

## 2018-11-25 MED ORDER — VALACYCLOVIR HCL 1 G PO TABS: 1000.0000 mg | ORAL_TABLET | Freq: Three times a day (TID) | ORAL | 0 refills | Status: AC

## 2018-11-25 NOTE — ED Provider Notes (Signed)
[facility]ER[facility] DEPARTMENT Provider Note   CSN: [id number] Arrival date & time: 11/25/18  0421    History   Chief Complaint Chief Complaint  Patient presents with  . Facial Pain    HPI Daniel Russell is a 48 y.o. male with a hx of asthma, pre-diabetes presents to the Emergency Department complaining of gradual, persistent, progressively worsening left sided facial pain onset approx 12 hours ago.  Pt reports developing facial droop on 11/18/2018.  He was evaluated in this ED and diagnosed with Bell's Palsy.  He reports he completed his course of prednisone and continues to take the Valtrex, but does not feel it is helping. Pt reports pain is a 10/10.  He reports mild facial swelling, but denies vision changes or known rash.  He denies otalgia, eye pain, tinnitus, hearing loss, otalgia and headache.  He denies fevers, dental pain, difficulty swallowing or voice change.  No treatments PTA.  Nothing seeming to make his symptoms better.  He reports eating and movement of his face make things worse.       The history is provided by the patient and medical records. No language interpreter was used.    Past Medical History:  Diagnosis Date  . Asthma    as a child  . Back pain   . Heart murmur   . Lipoma of chest wall    left chest  . Pre-diabetes   . Ulcer of the stomach and intestine     Patient Active Problem List   Diagnosis Date Noted  . Abdominal pain, epigastric 04/15/2018  . Change in bowel habit 04/15/2018  . Constipation 04/15/2018    Past Surgical History:  Procedure Laterality Date  . KNEE ARTHROSCOPY    . LIPOMA EXCISION Right 06/14/2017   Procedure: EXCISION SUBCUTANEOUS LIPOMA-LEFT CHEST WALL;  Surgeon: Donnie Mesa, MD;  Location: [facility];  Service: General;  Laterality: Right;        Home Medications    Prior to Admission medications   Medication Sig Start Date End Date Taking? Authorizing Provider  albuterol  (PROVENTIL HFA;VENTOLIN HFA) 108 (90 Base) MCG/ACT inhaler Inhale 1-2 puffs into the lungs every 6 (six) hours as needed for wheezing or shortness of breath. 12/27/17   Petrucelli, Samantha R, PA-C  APPLE CIDER VINEGAR PO Take by mouth. Takes 2 gummies daily    [provider]  cyclobenzaprine (FLEXERIL) 10 MG tablet Take 1 tablet (10 mg total) by mouth 2 (two) times daily as needed for up to 15 doses for muscle spasms. 09/06/18   Curatolo, Adam, DO  diclofenac (VOLTAREN) 25 MG EC tablet Take 25 mg by mouth 3 (three) times daily. 06/05/18   [provider]  docusate sodium (COLACE) 100 MG capsule Take 1 capsule (100 mg total) by mouth 2 (two) times daily. 09/28/18   Mansouraty, Telford Nab., MD  HYDROcodone-acetaminophen (NORCO/VICODIN) 5-325 MG tablet Take 1-2 tablets by mouth every 6 (six) hours as needed. 11/25/18   Isabeau Mccalla, Jarrett Soho, PA-C  omeprazole (PRILOSEC) 20 MG capsule Take 1 capsule (20 mg total) by mouth 2 (two) times a day for 14 days. 10/05/18 10/19/18  Mansouraty, Telford Nab., MD  omeprazole (PRILOSEC) 40 MG capsule Take 1 capsule (40 mg total) by mouth 2 (two) times a day. Patient taking differently: Take 40 mg by mouth 2 (two) times a day. Pt will start 40mg  twice daily, after completion of Pylera treatment. 09/28/18   Mansouraty, Telford Nab., MD  polycarbophil [facility]) 625  MG tablet Take 2 tablets (1,250 mg total) by mouth daily. 09/28/18   Mansouraty, Telford Nab., MD  polyethylene glycol powder (GLYCOLAX/MIRALAX) 17 GM/SCOOP powder Take 255 g by mouth 3 (three) times daily. 09/28/18   Mansouraty, Telford Nab., MD  tiZANidine (ZANAFLEX) 2 MG tablet Take 1-2 tablets (2-4 mg total) by mouth at bedtime as needed for muscle spasms. Patient not taking: Reported on 09/28/2018 06/21/18   Hilts, Legrand Como, MD  valACYclovir (VALTREX) 1000 MG tablet Take 1 tablet (1,000 mg total) by mouth 3 (three) times daily for 3 days. 11/25/18 11/28/18  Amy Gothard, Jarrett Soho, PA-C    Family History  Family History  Problem Relation Age of Onset  . Breast cancer Mother   . Hypertension Mother   . Lung cancer Father   . Diabetes Maternal Grandmother   . Diabetes Maternal Grandfather   . Diabetes Paternal Grandmother   . Lung cancer Paternal Aunt   . Stomach cancer Paternal Uncle   . Colon cancer Neg Hx   . Esophageal cancer Neg Hx   . Inflammatory bowel disease Neg Hx   . Liver disease Neg Hx   . Pancreatic cancer Neg Hx   . Rectal cancer Neg Hx   . Colon polyps Neg Hx     Social History Social History   Tobacco Use  . Smoking status: Current Every Day Smoker    Packs/day: 0.50    Types: Cigarettes  . Smokeless tobacco: Never Used  Substance Use Topics  . Alcohol use: No  . Drug use: No     Allergies   Patient has no known allergies.   Review of Systems Review of Systems  Constitutional: Negative for appetite change, diaphoresis, fatigue, fever and unexpected weight change.  HENT: Positive for facial swelling. Negative for mouth sores.        Facial pain  Eyes: Negative for visual disturbance.  Respiratory: Negative for cough, chest tightness, shortness of breath and wheezing.   Cardiovascular: Negative for chest pain.  Gastrointestinal: Negative for abdominal pain, constipation, diarrhea, nausea and vomiting.  Endocrine: Negative for polydipsia, polyphagia and polyuria.  Genitourinary: Negative for dysuria, frequency, hematuria and urgency.  Musculoskeletal: Negative for back pain and neck stiffness.  Skin: Negative for rash.  Allergic/Immunologic: Negative for immunocompromised state.  Neurological: Positive for facial asymmetry. Negative for syncope, light-headedness and headaches.  Hematological: Does not bruise/bleed easily.  Psychiatric/Behavioral: Negative for sleep disturbance. The patient is not nervous/anxious.      Physical Exam Updated Vital Signs BP 128/78 (BP Location: Right Arm)   Pulse 65   Temp 97.9 F (36.6 C) (Oral)   Resp 16    SpO2 96%   Physical Exam Vitals signs and nursing note reviewed.  Constitutional:      General: He is not in acute distress.    Appearance: He is not diaphoretic.  HENT:     Head: Normocephalic.     Comments: Mucous membranes moist No drooling. Slightly slurred speech.  Midline tongue extension.  No hot potato voice.     Right Ear: Tympanic membrane normal.     Left Ear: Tympanic membrane normal.     Mouth/Throat:     Mouth: Mucous membranes are moist.     Tongue: No lesions. Tongue does not deviate from midline.     Palate: No lesions.     Pharynx: Uvula midline. No uvula swelling.     Comments: Tenderness throughout the left side of the face. No lesions to the face, scalp, lips, nose,  ear canal or external ear. Sublingual tissue is soft without tenderness; no swelling of the submental space.   No TTP along the teeth or gingiva Eyes:     General: No scleral icterus.    Conjunctiva/sclera: Conjunctivae normal.  Neck:     Musculoskeletal: Normal range of motion.  Cardiovascular:     Rate and Rhythm: Normal rate and regular rhythm.     Pulses: Normal pulses.          Radial pulses are 2+ on the right side and 2+ on the left side.  Pulmonary:     Effort: No tachypnea, accessory muscle usage, prolonged expiration, respiratory distress or retractions.     Breath sounds: No stridor.     Comments: Equal chest rise. No increased work of breathing. Abdominal:     General: There is no distension.     Palpations: Abdomen is soft.     Tenderness: There is no abdominal tenderness. There is no guarding or rebound.  Musculoskeletal:     Comments: Moves all extremities equally and without difficulty.  Lymphadenopathy:     Head:     Right side of head: No submental, submandibular, tonsillar, preauricular, posterior auricular or occipital adenopathy.     Left side of head: Tonsillar, preauricular and posterior auricular adenopathy present. No submental, submandibular or occipital  adenopathy.     Cervical: No cervical adenopathy.  Skin:    General: Skin is warm and dry.     Capillary Refill: Capillary refill takes less than 2 seconds.  Neurological:     Mental Status: He is alert.     GCS: GCS eye subscore is 4. GCS verbal subscore is 5. GCS motor subscore is 6.     Comments: Speech is clear and goal oriented. Left sided facial droop with involvement of the forehead; unable to raise his left eyebrow, close his left eye tightly or smile with the left side of his face.  Left sided nasolabial crease flattening. Right side facial movements are intact.   Strength 5/5 in bilateral upper and lower extremities including grip strength and dorsiflexion/plantarflexion bilaterally.   Normal finger-nose-finger, rapid alternating movements bilaterally.  Midline uvula with symmetric rise.    Psychiatric:        Mood and Affect: Mood normal.      ED Treatments / Results   Procedures Procedures (including critical care time)  Medications Ordered in ED Medications  HYDROcodone-acetaminophen (NORCO/VICODIN) 5-325 MG per tablet 1 tablet (1 tablet Oral Given 11/25/18 0528)     Initial Impression / Assessment and Plan / ED Course  I have reviewed the triage vital signs and the nursing notes.  Pertinent labs & imaging results that were available during my care of the patient were reviewed by me and considered in my medical decision making (see chart for details).        Patient presents with left-sided facial pain approximately 1 week after development of Bell's palsy.  At that time, patient was given short course of steroids and 7 days of Valtrex.  He reports compliance with these medications.  He reports 2 days of Valtrex remaining.  On exam, patient with significant sensitivity to palpation through the entire distribution of the facial nerve.  He continues to have symptoms of Bell's palsy but no other focal neurologic deficits.  He has no lesions on his face, nose, lips  external or internal ear.  He does have some left-sided tonsillar, preauricular and posterior auricular lymphadenopathy.  No erythema or increased  warmth to the face to suggest cellulitis.  No swelling of the salivary gland.  No swelling of the submental space or sublingual tissues to suggest deep space infection.  No dental pain.  Is without fever, neck pain, neck stiffness, nuchal rigidity, tinnitus, otalgia or vision changes.  Doubt meningitis.  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  Patient's course of Valtrex will be extended to a full 10 days (from initial course of 7 days) and he will be discharged home with pain control.  He will need close follow-up with his primary care provider.  He is to return immediately to the emergency department for development of lesions around his eyes, otalgia, hearing loss or worsening symptoms.  Patient states understanding and is in agreement with the plan.   Final Clinical Impressions(s) / ED Diagnoses   Final diagnoses:  Facial pain  Bell's palsy    ED Discharge Orders         Ordered    HYDROcodone-acetaminophen (NORCO/VICODIN) 5-325 MG tablet  Every 6 hours PRN     11/25/18 0612    valACYclovir (VALTREX) 1000 MG tablet  3 times daily     11/25/18 0612           Caidence Higashi, Jarrett Soho, PA-C 11/25/18 0624    Ward, Delice Bison, DO 11/25/18 [phone]

## 2018-11-25 NOTE — ED Triage Notes (Signed)
Pt reports increasing facial pain.  States it hurts to speak.  Pt continues to have left sided facial droop.

## 2018-11-25 NOTE — ED Notes (Signed)
Pt provided several eye pads and paper tape, work note provided.  Pt ambulatory to [facility] with steady gait. Pt verbalized understanding of d/c instructions and follow up.

## 2018-11-25 NOTE — Discharge Instructions (Addendum)
1. Medications: Vicodin, usual home medications 2. Treatment: rest, drink plenty of fluids,  3. Follow Up: Please followup with your primary doctor in 2 days for discussion of your diagnoses and further evaluation after today's visit; if you do not have a primary care doctor use the resource guide provided to find one; Please return to the ER for worsening pain, fevers, worsening swelling, development of lesions, ear pain, hearing loss, ringing in the ear, vision changes or other concerns

## 2018-12-19 ENCOUNTER — Ambulatory Visit: Payer: Self-pay

## 2018-12-19 ENCOUNTER — Ambulatory Visit (INDEPENDENT_AMBULATORY_CARE_PROVIDER_SITE_OTHER): Payer: BC Managed Care – PPO | Admitting: Family Medicine

## 2018-12-19 ENCOUNTER — Encounter: Payer: Self-pay | Admitting: Family Medicine

## 2018-12-19 DIAGNOSIS — M25561 Pain in right knee: Secondary | ICD-10-CM

## 2018-12-19 MED ORDER — CELECOXIB 200 MG PO CAPS: 200.0000 mg | ORAL_CAPSULE | Freq: Two times a day (BID) | ORAL | 3 refills | Status: DC | PRN

## 2018-12-19 NOTE — Progress Notes (Signed)
Office Visit Note   Patient: Daniel Russell           Date of Birth: Jul 11, 1970           MRN: [id number] Visit Date: 12/19/2018 Requested by: [name], MD [address] [place],  [place] [address] PCP: [name], MD  Subjective: Chief Complaint  Patient presents with  . Right Knee - Pain    Pain in the knee since 12/15/2018 - started that morning and went away that night. Pain returned yesterday and much worse. 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 "I think I worked too hard." Knee gave way yesterday. Swelling in knee.    HPI: He is here with right knee pain.  Symptoms started about 3 or 4 days ago, he thinks it was from working hard at [employer].  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  There was no moment of injury, just gradual onset of pain and stiffness.  He used heat last night and it feels better today but he is still walking with a limp.  He recalls having knee surgery when he was a child, possibly 74 or 78 years old.  He is not sure exactly what was done.  He has been fine until recently.  Incidentally, he and his wife were finally able to buy a house.  He is very happy about this.               ROS: Denies fevers or chills.  All other systems were reviewed and are negative.  Objective: Vital Signs: There were no vitals taken for this visit.  Physical Exam:  General:  Alert and oriented, in no acute distress. Pulm:  Breathing unlabored. Psy:  Normal mood, congruent affect. Skin: No rash or erythema. Right knee: 1-2+ effusion with no warmth.  Full range of motion, negative patella apprehension and compression test.  Lockman's is solid, no laxity with varus/valgus stress.  Very tender on the posterior lateral joint line, pain but no definite click with McMurray's.  Imaging: X-rays right knee: Well-preserved joint spaces, no significant arthritic change, no sign of stress fracture or loose body.    Assessment & Plan: 1.  Right knee pain with  effusion, question lateral meniscus tear -Voltaren gel topically, Celebrex by mouth.  If symptoms persist, we will aspirate and inject with cortisone.  If still no relief, then possibly MRI scan.     Procedures: No procedures performed  No notes on file     PMFS History: Patient Active Problem List   Diagnosis Date Noted  . Abdominal pain, epigastric 04/15/2018  . Change in bowel habit 04/15/2018  . Constipation 04/15/2018   Past Medical History:  Diagnosis Date  . Asthma    as a child  . Back pain   . Heart murmur   . Lipoma of chest wall    left chest  . Pre-diabetes   . Ulcer of the stomach and intestine     Family History  Problem Relation Age of Onset  . Breast cancer Mother   . Hypertension Mother   . Lung cancer Father   . Diabetes Maternal Grandmother   . Diabetes Maternal Grandfather   . Diabetes Paternal Grandmother   . Lung cancer Paternal Aunt   . Stomach cancer Paternal Uncle   . Colon cancer Neg Hx   . Esophageal cancer Neg Hx   . Inflammatory bowel disease Neg Hx   . Liver disease Neg Hx   .  Pancreatic cancer Neg Hx   . Rectal cancer Neg Hx   . Colon polyps Neg Hx     Past Surgical History:  Procedure Laterality Date  . KNEE ARTHROSCOPY    . LIPOMA EXCISION Right 06/14/2017   Procedure: EXCISION SUBCUTANEOUS LIPOMA-LEFT CHEST WALL;  Surgeon: Donnie Mesa, MD;  Location: [facility];  Service: General;  Laterality: Right;   Social History   Occupational History  . Occupation: Glass blower/designer  Tobacco Use  . Smoking status: Current Every Day Smoker    Packs/day: 0.50    Types: Cigarettes  . Smokeless tobacco: Never Used  Substance and Sexual Activity  . Alcohol use: No  . Drug use: No  . Sexual activity: Not on file

## 2018-12-20 ENCOUNTER — Telehealth: Payer: Self-pay | Admitting: Family Medicine

## 2018-12-20 NOTE — Telephone Encounter (Signed)
I recalled the generic celebrex in to [facility], leaving it on their voice mail. I called the patient and advised him of this.

## 2018-12-20 NOTE — Telephone Encounter (Signed)
Patient called left voicemail message that the Rx is not showing at [facility] on [address]. The number to contact patient is [phone]

## 2018-12-26 ENCOUNTER — Encounter: Payer: Self-pay | Admitting: Family Medicine

## 2018-12-26 ENCOUNTER — Ambulatory Visit (INDEPENDENT_AMBULATORY_CARE_PROVIDER_SITE_OTHER): Payer: BC Managed Care – PPO | Admitting: Family Medicine

## 2018-12-26 DIAGNOSIS — M25561 Pain in right knee: Secondary | ICD-10-CM | POA: Diagnosis not present

## 2018-12-26 NOTE — Progress Notes (Signed)
Daniel Russell - 48 y.o. male MRN [id number]  Date of birth: Feb 18, 1971  Office Visit Note: Visit Date: 12/26/2018 PCP: [name], MD Referred by: [name], MD  Subjective: Chief Complaint  Patient presents with  . Right Knee - Pain, Follow-up   HPI: Daniel Russell is a 48 y.o. male who comes in today for folllow pu of right knee pain.  Was seen in clinic on 8/11, given celebrex. Reports that since he was seen last week, his right knee swelled more and on Sunday he could hardly walk at work. He presented to the ED and was given percocet for pain. Since then, the swelling in his knee has improved as he has been resting more and pain has improved since taking percocet and celebrex. Plans to return to work at [employer] but hopes to have a less labor intensive assignment.   Otherwise per HPI.  Assessment & Plan: Visit Diagnoses:  1. Acute pain of right knee     Plan:  - effusion drained, provided corticosteroid injection - continue celebrex PRN - if pain and swelling persists, return for MRI   Follow-up: PRN   Procedures: Procedure performed: knee intraarticular corticosteroid injection; landmark guided  Consent obtained and verified. Right knee steroid injection: Noted no overlying erythema, induration, or other signs of local infection. After sterile prep with Betadine, injected 5 cc 1% lidocaine without epinephrine and drained 40 mls of serous fluid and 40 mg methylprednisolone from superolateral approach.    Clinical History: No specialty comments available.   He reports that he has been smoking cigarettes. He has been smoking about 0.50 packs per day. He has never used smokeless tobacco. No results for input(s): HGBA1C, LABURIC in the last 8760 hours.  Objective:  VS:  HT:    WT:   BMI:     BP:   HR: bpm  TEMP: ( )  RESP:  Physical Exam  PHYSICAL EXAM: Gen: NAD, alert, cooperative with exam, well-appearing HEENT: clear conjunctiva,  CV:  no  edema, capillary refill brisk, normal rate Resp: non-labored Skin: no rashes, normal turgor  Neuro: no gross deficits.  Psych:  alert and oriented  Ortho Exam  Right Knee: - Inspection: Medium effusion noted with no erythema or bruising. Skin intact - Palpation: TTP along joint line, lateral > medial - ROM: full active ROM with flexion and extension in knee and hip - Strength: 5/5 strength - Neuro/vasc: NV intact - Special Tests: - LIGAMENTS: negative anterior and posterior drawer, negative Lachman's, no MCL or LCL laxity  -- MENISCUS: negative McMurray's -- PF JOINT: nml patellar mobility bilaterally  Hips: normal ROM, negative FABER and FADIR bilaterally  Imaging: No results found.  Past Medical/Family/Surgical/Social History: Medications & Allergies reviewed per EMR, new medications updated. Patient Active Problem List   Diagnosis Date Noted  . Abdominal pain, epigastric 04/15/2018  . Change in bowel habit 04/15/2018  . Constipation 04/15/2018   Past Medical History:  Diagnosis Date  . Asthma    as a child  . Back pain   . Heart murmur   . Lipoma of chest wall    left chest  . Pre-diabetes   . Ulcer of the stomach and intestine    Family History  Problem Relation Age of Onset  . Breast cancer Mother   . Hypertension Mother   . Lung cancer Father   . Diabetes Maternal Grandmother   . Diabetes Maternal Grandfather   . Diabetes Paternal Grandmother   . Lung  cancer Paternal Aunt   . Stomach cancer Paternal Uncle   . Colon cancer Neg Hx   . Esophageal cancer Neg Hx   . Inflammatory bowel disease Neg Hx   . Liver disease Neg Hx   . Pancreatic cancer Neg Hx   . Rectal cancer Neg Hx   . Colon polyps Neg Hx    Past Surgical History:  Procedure Laterality Date  . KNEE ARTHROSCOPY    . LIPOMA EXCISION Right 06/14/2017   Procedure: EXCISION SUBCUTANEOUS LIPOMA-LEFT CHEST WALL;  Surgeon: Donnie Mesa, MD;  Location: [facility];  Service: General;   Laterality: Right;   Social History   Occupational History  . Occupation: Glass blower/designer  Tobacco Use  . Smoking status: Current Every Day Smoker    Packs/day: 0.50    Types: Cigarettes  . Smokeless tobacco: Never Used  Substance and Sexual Activity  . Alcohol use: No  . Drug use: No  . Sexual activity: Not on file

## 2018-12-26 NOTE — Progress Notes (Signed)
I saw and examined the patient with Dr. Mayer Masker and agree with assessment and plan as outlined.  Aspirated 40 cc serous synovial fluid with white particulate matter.  Injected 40 mg methylprednisolone.  If pain persists, MRI.

## 2019-01-24 ENCOUNTER — Encounter: Payer: Self-pay | Admitting: Family Medicine

## 2019-01-24 ENCOUNTER — Ambulatory Visit (INDEPENDENT_AMBULATORY_CARE_PROVIDER_SITE_OTHER): Payer: BLUE CROSS/BLUE SHIELD | Admitting: Family Medicine

## 2019-01-24 DIAGNOSIS — M25561 Pain in right knee: Secondary | ICD-10-CM | POA: Diagnosis not present

## 2019-01-24 NOTE — Progress Notes (Signed)
Office Visit Note   Patient: Daniel Russell           Date of Birth: 01/30/1971           MRN: [id number] Visit Date: 01/24/2019 Requested by: [name], MD [address] [place],  [place] [id number] PCP: [name], MD  Subjective: Chief Complaint  Patient presents with  . Right Knee - Pain    Pain and swelling in the knee in the knee again. Has been working overtime at [employer]. Limping. Requests note for work to restrict lifting, pushing, pulling...    HPI: He is here with recurrent right knee pain.  Injection helped for a few days, but his work is very physically demanding and he had to do a lot of strenuous activity which seemed to flared up again.  It is popping intermittently but not locking.              ROS:   All other systems were reviewed and are negative.  Objective: Vital Signs: There were no vitals taken for this visit.  Physical Exam:  General:  Alert and oriented, in no acute distress. Pulm:  Breathing unlabored. Psy:  Normal mood, congruent affect. Skin: No bruising or erythema. Right knee: 1+ effusion with no warmth.  Full active extension and flexion.  Tender on the medial joint line with pain and a palpable click on McMurray's.  Imaging: None today.  Assessment & Plan: 1.  Recurrent right knee pain, question degenerative meniscus tear -We will modify his activities at work for the next 4 weeks.  If symptoms persist could try 1 more injection.  If that still does not help, then possibly MRI scan.     Procedures: No procedures performed  No notes on file     PMFS History: Patient Active Problem List   Diagnosis Date Noted  . Abdominal pain, epigastric 04/15/2018  . Change in bowel habit 04/15/2018  . Constipation 04/15/2018   Past Medical History:  Diagnosis Date  . Asthma    as a child  . Back pain   . Heart murmur   . Lipoma of chest wall    left chest  . Pre-diabetes   . Ulcer of the stomach and intestine     Family History   Problem Relation Age of Onset  . Breast cancer Mother   . Hypertension Mother   . Lung cancer Father   . Diabetes Maternal Grandmother   . Diabetes Maternal Grandfather   . Diabetes Paternal Grandmother   . Lung cancer Paternal Aunt   . Stomach cancer Paternal Uncle   . Colon cancer Neg Hx   . Esophageal cancer Neg Hx   . Inflammatory bowel disease Neg Hx   . Liver disease Neg Hx   . Pancreatic cancer Neg Hx   . Rectal cancer Neg Hx   . Colon polyps Neg Hx     Past Surgical History:  Procedure Laterality Date  . KNEE ARTHROSCOPY    . LIPOMA EXCISION Right 06/14/2017   Procedure: EXCISION SUBCUTANEOUS LIPOMA-LEFT CHEST WALL;  Surgeon: Donnie Mesa, MD;  Location: [facility];  Service: General;  Laterality: Right;   Social History   Occupational History  . Occupation: Glass blower/designer  Tobacco Use  . Smoking status: Current Every Day Smoker    Packs/day: 0.50    Types: Cigarettes  . Smokeless tobacco: Never Used  Substance and Sexual Activity  . Alcohol use: No  . Drug use: No  .  Sexual activity: Not on file

## 2019-02-09 ENCOUNTER — Telehealth: Payer: Self-pay | Admitting: Family Medicine

## 2019-02-09 NOTE — Telephone Encounter (Signed)
This is a duplicate message - see the other message from today.

## 2019-02-09 NOTE — Telephone Encounter (Signed)
Pt states his employer is requesting a new updates letter that states how many pounds or if he should not lift anything. Pt's call back # [phone]

## 2019-02-09 NOTE — Telephone Encounter (Signed)
Would limit to 20 pounds lifting, pushing, pulling.

## 2019-02-09 NOTE — Telephone Encounter (Signed)
Please advise 

## 2019-02-09 NOTE — Telephone Encounter (Signed)
I advised the patient's wife of the more specific limitation. The new note will be ready for pickup this afternoon.

## 2019-02-09 NOTE — Telephone Encounter (Signed)
Patient's wife called stating that the patient's employer needs a more specific break down of how much weight he can lift, push, etc.  Patient would like to come pick up this note this afternoon.  CB#[phone].  Thank you.

## 2019-02-19 ENCOUNTER — Encounter: Payer: Self-pay | Admitting: Family Medicine

## 2019-02-19 ENCOUNTER — Other Ambulatory Visit: Payer: Self-pay

## 2019-02-19 ENCOUNTER — Ambulatory Visit (INDEPENDENT_AMBULATORY_CARE_PROVIDER_SITE_OTHER): Payer: BC Managed Care – PPO | Admitting: Family Medicine

## 2019-02-19 DIAGNOSIS — M25561 Pain in right knee: Secondary | ICD-10-CM

## 2019-02-19 NOTE — Progress Notes (Signed)
Office Visit Note   Patient: Daniel Russell           Date of Birth: 10-13-1970           MRN: [id number] Visit Date: 02/19/2019 Requested by: [name], MD [address],  [place] [address] PCP: [name], MD  Subjective: Chief Complaint  Patient presents with  . Right Knee - Pain, Follow-up    HPI: He is here with a flareup of right knee pain.  He was doing much better after last visit, doing a modified work which is much less strenuous on his knee.  He was not pain-free but it was very tolerable.  Then recently at work, he was sitting in the break room and got up and tripped over an uneven surface on the floor causing his leg to bend backward.  He had immediate pain, has been walking with a limp, and has had increased swelling in his knee.  He is using Voltaren gel which is helping him get some sleep.              ROS: No fevers or chills.  All other systems were reviewed and are negative.  Objective: Vital Signs: There were no vitals taken for this visit.  Physical Exam:  General:  Alert and oriented, in no acute distress. Pulm:  Breathing unlabored. Psy:  Normal mood, congruent affect. Skin: No bruising or erythema. Right knee: 1+ effusion with no warmth.  Ligaments feel stable, no pain with patella compression or apprehension, no tenderness over the quadriceps or patellar tendons.  He is very tender on the medial joint line today and has pain as well as a palpable click on McMurray's.  Mild lateral joint line tenderness.  Imaging: None today.  Assessment & Plan: 1.  Recurrent right knee pain and effusion, concerning for meniscus tear. -We elected to proceed with MRI scan.  Surgical consult if indicated.  Continue with light duty work.     Procedures: No procedures performed  No notes on file     PMFS History: Patient Active Problem List   Diagnosis Date Noted  . Abdominal pain, epigastric 04/15/2018  . Change in bowel habit 04/15/2018  .  Constipation 04/15/2018   Past Medical History:  Diagnosis Date  . Asthma    as a child  . Back pain   . Heart murmur   . Lipoma of chest wall    left chest  . Pre-diabetes   . Ulcer of the stomach and intestine     Family History  Problem Relation Age of Onset  . Breast cancer Mother   . Hypertension Mother   . Lung cancer Father   . Diabetes Maternal Grandmother   . Diabetes Maternal Grandfather   . Diabetes Paternal Grandmother   . Lung cancer Paternal Aunt   . Stomach cancer Paternal Uncle   . Colon cancer Neg Hx   . Esophageal cancer Neg Hx   . Inflammatory bowel disease Neg Hx   . Liver disease Neg Hx   . Pancreatic cancer Neg Hx   . Rectal cancer Neg Hx   . Colon polyps Neg Hx     Past Surgical History:  Procedure Laterality Date  . KNEE ARTHROSCOPY    . LIPOMA EXCISION Right 06/14/2017   Procedure: EXCISION SUBCUTANEOUS LIPOMA-LEFT CHEST WALL;  Surgeon: Donnie Mesa, MD;  Location: [facility];  Service: General;  Laterality: Right;   Social History   Occupational History  .  Occupation: Glass blower/designer  Tobacco Use  . Smoking status: Current Every Day Smoker    Packs/day: 0.50    Types: Cigarettes  . Smokeless tobacco: Never Used  Substance and Sexual Activity  . Alcohol use: No  . Drug use: No  . Sexual activity: Not on file

## 2019-02-22 ENCOUNTER — Ambulatory Visit: Payer: BLUE CROSS/BLUE SHIELD | Admitting: Family Medicine

## 2019-03-09 ENCOUNTER — Ambulatory Visit
Admission: RE | Admit: 2019-03-09 | Discharge: 2019-03-09 | Disposition: A | Discharge status: Home / Self Care | Payer: BC Managed Care – PPO | Source: Ambulatory Visit | Attending: Family Medicine | Admitting: Family Medicine

## 2019-03-09 ENCOUNTER — Other Ambulatory Visit: Payer: Self-pay

## 2019-03-09 DIAGNOSIS — M25561 Pain in right knee: Secondary | ICD-10-CM

## 2019-03-11 ENCOUNTER — Telehealth: Payer: Self-pay | Admitting: Family Medicine

## 2019-03-11 NOTE — Telephone Encounter (Signed)
MRI shows a medial meniscus tear.  There is also some arthritis in the joint, but the meniscus tear is the most likely source of pain.  I recommend consultation with Dr. Alphonzo Severance to discuss arthroscopic surgery.

## 2019-03-12 ENCOUNTER — Telehealth: Payer: Self-pay | Admitting: Orthopedic Surgery

## 2019-03-12 NOTE — Telephone Encounter (Signed)
Please call patient and get appt scheduled to discuss surgery. Thanks.

## 2019-03-12 NOTE — Telephone Encounter (Signed)
Called patient left message on voicemail to return call to schedule an appointment with Dr Marlou Sa to discuss surgery per Ander Purpura

## 2019-03-21 ENCOUNTER — Encounter: Payer: Self-pay | Admitting: Orthopedic Surgery

## 2019-03-21 ENCOUNTER — Ambulatory Visit (INDEPENDENT_AMBULATORY_CARE_PROVIDER_SITE_OTHER): Payer: BC Managed Care – PPO | Admitting: Orthopedic Surgery

## 2019-03-21 ENCOUNTER — Other Ambulatory Visit: Payer: Self-pay

## 2019-03-21 DIAGNOSIS — S83241D Other tear of medial meniscus, current injury, right knee, subsequent encounter: Secondary | ICD-10-CM | POA: Diagnosis not present

## 2019-03-21 NOTE — Progress Notes (Signed)
Office Visit Note   Patient: Daniel Russell           Date of Birth: 18-Nov-1970           MRN: [id number] Visit Date: 03/21/2019 Requested by: [name], MD [address] [place],  [place] [address] PCP: [name], MD  Subjective: Chief Complaint  Patient presents with  . Right Knee - Pain    HPI: [name] is a patient with right knee pain.  Sustained an injury several months ago.  Works at [employer].  Has had pain in that knee since June.  He was doing a lot of pulling and pushing but now he works as a Education officer, environmental.  Did have an injury at age 48 but recovered from that well.  States that the knee swells at times.  Reports pain just above the patella as well as posteriorly and some on the medial side as well.  Denies much in the way of mechanical symptoms but does report pain and swelling.  No personal or family history of DVT or pulmonary embolism.  MRI scan is reviewed and it does show small chondral defect on the medial femoral condyle along with posterior horn medial meniscal tear.  Trace effusion is present.              ROS: All systems reviewed are negative as they relate to the chief complaint within the history of present illness.  Patient denies  fevers or chills.   Assessment & Plan: Visit Diagnoses:  1. Acute medial meniscus tear of right knee, subsequent encounter     Plan: Impression is right knee acute medial meniscal tear likely sustained sometime in June with some swelling as well as some chondral injury.  Plan is right knee arthroscopy and debridement possible microfracture.  Risk benefits are discussed include not limited to infection nerve vessel damage incomplete pain relief as well as stiffness.  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  All questions answered.  Follow-Up Instructions: No follow-ups on file.   Orders:  No orders of the defined types were placed in this  encounter.  No orders of the defined types were placed in this encounter.     Procedures: No procedures performed   Clinical Data: No additional findings.  Objective: Vital Signs: There were no vitals taken for this visit.  Physical Exam:   Constitutional: Patient appears well-developed HEENT:  Head: Normocephalic Eyes:EOM are normal Neck: Normal range of motion Cardiovascular: Normal rate Pulmonary/chest: Effort normal Neurologic: Patient is alert Skin: Skin is warm Psychiatric: Patient has normal mood and affect    Ortho Exam: Ortho exam demonstrates trace effusion right knee good range of motion mild medial no lateral joint line tenderness intact since mechanism.  Collateral crucial ligaments are stable.  Pedal pulses palpable.  No groin pain with internal X rotation leg.  Specialty Comments:  No specialty comments available.  Imaging: No results found.   PMFS History: Patient Active Problem List   Diagnosis Date Noted  . Abdominal pain, epigastric 04/15/2018  . Change in bowel habit 04/15/2018  . Constipation 04/15/2018   Past Medical History:  Diagnosis Date  . Asthma    as a child  . Back pain   . Heart murmur   . Lipoma of chest wall    left chest  . Pre-diabetes   . Ulcer of the stomach and intestine  Family History  Problem Relation Age of Onset  . Breast cancer Mother   . Hypertension Mother   . Lung cancer Father   . Diabetes Maternal Grandmother   . Diabetes Maternal Grandfather   . Diabetes Paternal Grandmother   . Lung cancer Paternal Aunt   . Stomach cancer Paternal Uncle   . Colon cancer Neg Hx   . Esophageal cancer Neg Hx   . Inflammatory bowel disease Neg Hx   . Liver disease Neg Hx   . Pancreatic cancer Neg Hx   . Rectal cancer Neg Hx   . Colon polyps Neg Hx     Past Surgical History:  Procedure Laterality Date  . KNEE ARTHROSCOPY    . LIPOMA EXCISION Right 06/14/2017   Procedure: EXCISION SUBCUTANEOUS LIPOMA-LEFT  CHEST WALL;  Surgeon: Donnie Mesa, MD;  Location: [facility];  Service: General;  Laterality: Right;   Social History   Occupational History  . Occupation: Glass blower/designer  Tobacco Use  . Smoking status: Current Every Day Smoker    Packs/day: 0.50    Types: Cigarettes  . Smokeless tobacco: Never Used  Substance and Sexual Activity  . Alcohol use: No  . Drug use: No  . Sexual activity: Not on file

## 2019-04-20 ENCOUNTER — Encounter: Payer: Self-pay | Admitting: Family Medicine

## 2019-04-20 ENCOUNTER — Telehealth: Payer: Self-pay | Admitting: Family Medicine

## 2019-04-20 NOTE — Telephone Encounter (Signed)
The patient will pick this up around 3 today.

## 2019-04-20 NOTE — Telephone Encounter (Signed)
The patient's previous work note has expired. He will be having knee arthroscopy on 12/21 with Dr. Marlou Sa and will have a post op visit on 12/30 with Artis Delay. Per the wife, he has still been working with restrictions and will continue up until his surgery date (and then go out of work postop). Do we supply a work note up to the postop date, that will include at least 1 week of no work, and then [employer] write a new one at that visit?

## 2019-04-20 NOTE — Telephone Encounter (Signed)
Printed

## 2019-04-20 NOTE — Telephone Encounter (Signed)
Patient's wife called stating that the patient's employer needs an updated note by 5:00 today stating what his restrictions are.  Patient's wife would like you to call her when the note is ready.  CB#[phone].  They would like to pick it up when he gets off of work at 12:00.  Thank you

## 2019-04-30 ENCOUNTER — Encounter: Payer: Self-pay | Admitting: Orthopedic Surgery

## 2019-04-30 ENCOUNTER — Other Ambulatory Visit: Payer: Self-pay | Admitting: Surgical

## 2019-04-30 DIAGNOSIS — S83231D Complex tear of medial meniscus, current injury, right knee, subsequent encounter: Secondary | ICD-10-CM

## 2019-04-30 DIAGNOSIS — M94261 Chondromalacia, right knee: Secondary | ICD-10-CM

## 2019-04-30 MED ORDER — METHOCARBAMOL 500 MG PO TABS: 500.0000 mg | ORAL_TABLET | Freq: Three times a day (TID) | ORAL | 0 refills | Status: DC | PRN

## 2019-04-30 MED ORDER — OXYCODONE HCL 5 MG PO TABS: 5.0000 mg | ORAL_TABLET | ORAL | 0 refills | Status: AC | PRN

## 2019-04-30 MED ORDER — ASPIRIN EC 81 MG PO TBEC: 81.0000 mg | DELAYED_RELEASE_TABLET | Freq: Every day | ORAL | 0 refills | Status: AC

## 2019-05-02 ENCOUNTER — Telehealth: Payer: Self-pay | Admitting: Orthopedic Surgery

## 2019-05-02 NOTE — Telephone Encounter (Signed)
Patient called. He would like a note out of work past 05/14/2019. Says he will not be able to go back then.  His call back number is [phone]

## 2019-05-02 NOTE — Telephone Encounter (Signed)
Please advise. Thanks.  

## 2019-05-02 NOTE — Telephone Encounter (Signed)
IC s/w patient. He would like to wait until his follow up appt on 12/30 with Artis Delay for his post op check to get updated work note.

## 2019-05-02 NOTE — Telephone Encounter (Signed)
Ok for this? 

## 2019-05-09 ENCOUNTER — Ambulatory Visit (INDEPENDENT_AMBULATORY_CARE_PROVIDER_SITE_OTHER): Payer: BC Managed Care – PPO | Admitting: Family

## 2019-05-09 ENCOUNTER — Other Ambulatory Visit: Payer: Self-pay

## 2019-05-09 ENCOUNTER — Encounter: Payer: Self-pay | Admitting: Family

## 2019-05-09 VITALS — Ht 70.0 in | Wt 205.4 lb

## 2019-05-09 DIAGNOSIS — S83241D Other tear of medial meniscus, current injury, right knee, subsequent encounter: Secondary | ICD-10-CM

## 2019-05-09 NOTE — Progress Notes (Signed)
   Post-Op Visit Note   Patient: Daniel Russell           Date of Birth: 1971/03/12           MRN: [id number] Visit Date: 05/09/2019 PCP: [name], MD  Chief Complaint:  Chief Complaint  Patient presents with  . Right Knee - Routine Post Op    04/30/2019 right knee scope meniscus tear    HPI:  HPI The patient is a 48 year old gentleman seen today status post right knee arthroscopy for meniscal tear with Dr. Marlou Sa surgery was on December 21.  He is weightbearing with 1 crutch he does report some nausea from his oxycodone.  Ortho Exam Portals are well-healed sutures are in place there is moderate swelling to his knee there is no warmth no erythema no pain with flexion extension  Visit Diagnoses:  1. Acute medial meniscus tear of right knee, subsequent encounter     Plan: Sutures harvested today.  He will continue advancing his weightbearing as tolerated.  Provided a note to remain out of work he will follow-up with Dr. Marlou Sa in 3 weeks.  Follow-Up Instructions: Return in about 3 weeks (around 05/30/2019).   Imaging: No results found.  Orders:  No orders of the defined types were placed in this encounter.  No orders of the defined types were placed in this encounter.    PMFS History: Patient Active Problem List   Diagnosis Date Noted  . Abdominal pain, epigastric 04/15/2018  . Change in bowel habit 04/15/2018  . Constipation 04/15/2018   Past Medical History:  Diagnosis Date  . Asthma    as a child  . Back pain   . Heart murmur   . Lipoma of chest wall    left chest  . Pre-diabetes   . Ulcer of the stomach and intestine     Family History  Problem Relation Age of Onset  . Breast cancer Mother   . Hypertension Mother   . Lung cancer Father   . Diabetes Maternal Grandmother   . Diabetes Maternal Grandfather   . Diabetes Paternal Grandmother   . Lung cancer Paternal Aunt   . Stomach cancer Paternal Uncle   . Colon cancer Neg Hx   . Esophageal cancer Neg  Hx   . Inflammatory bowel disease Neg Hx   . Liver disease Neg Hx   . Pancreatic cancer Neg Hx   . Rectal cancer Neg Hx   . Colon polyps Neg Hx     Past Surgical History:  Procedure Laterality Date  . KNEE ARTHROSCOPY    . LIPOMA EXCISION Right 06/14/2017   Procedure: EXCISION SUBCUTANEOUS LIPOMA-LEFT CHEST WALL;  Surgeon: Donnie Mesa, MD;  Location: [facility];  Service: General;  Laterality: Right;   Social History   Occupational History  . Occupation: Glass blower/designer  Tobacco Use  . Smoking status: Current Every Day Smoker    Packs/day: 0.50    Types: Cigarettes  . Smokeless tobacco: Never Used  Substance and Sexual Activity  . Alcohol use: No  . Drug use: No  . Sexual activity: Not on file

## 2019-05-25 ENCOUNTER — Other Ambulatory Visit: Payer: Self-pay

## 2019-05-25 ENCOUNTER — Encounter: Payer: Self-pay | Admitting: Orthopedic Surgery

## 2019-05-25 ENCOUNTER — Ambulatory Visit (INDEPENDENT_AMBULATORY_CARE_PROVIDER_SITE_OTHER): Payer: BC Managed Care – PPO | Admitting: Orthopedic Surgery

## 2019-05-25 DIAGNOSIS — S83241D Other tear of medial meniscus, current injury, right knee, subsequent encounter: Secondary | ICD-10-CM

## 2019-05-25 NOTE — Progress Notes (Addendum)
Post-Op Visit Note   Patient: Marguerite Medine           Date of Birth: 1970-11-07           MRN: [id number] Visit Date: 05/25/2019 PCP: [name], MD   Assessment & Plan:  Chief Complaint:  Chief Complaint  Patient presents with  . Right Knee - Follow-up   Visit Diagnoses:  1. Acute medial meniscus tear of right knee, subsequent encounter     Plan: Patient is a 49 year old male who presents s/p right knee arthroscopy with debridement on 04/30/2019.  Patient notes that he is doing okay but complains of some persistent stiffness and swelling.  He is doing a home exercise program focusing on quad strengthening and doing steps at home.  He is taking oxycodone only about 1-2 times per week as needed for pain.  On exam he does have a significant effusion with extension to about 5 degrees and 100 degrees of knee flexion.  Incisions are healing well with some scab formation over the lateral portal but no drainage coming from this.  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  This is not a workers comp injury.  He remains out of work.  Right knee was aspirated today and patient tolerated the procedure well.  About 45 cc of blood-tinged fluid was obtained.  Plan for patient to return to the office in 4 weeks for clinical recheck and we will determine his back to work status at that time.  Follow-Up Instructions: No follow-ups on file.   Orders:  No orders of the defined types were placed in this encounter.  No orders of the defined types were placed in this encounter.   Imaging: No results found.  PMFS History: Patient Active Problem List   Diagnosis Date Noted  . Abdominal pain, epigastric 04/15/2018  . Change in bowel habit 04/15/2018  . Constipation 04/15/2018   Past Medical History:  Diagnosis Date  . Asthma    as a child  . Back pain   . Heart murmur   . Lipoma of chest wall    left chest  . Pre-diabetes   . Ulcer of the  stomach and intestine     Family History  Problem Relation Age of Onset  . Breast cancer Mother   . Hypertension Mother   . Lung cancer Father   . Diabetes Maternal Grandmother   . Diabetes Maternal Grandfather   . Diabetes Paternal Grandmother   . Lung cancer Paternal Aunt   . Stomach cancer Paternal Uncle   . Colon cancer Neg Hx   . Esophageal cancer Neg Hx   . Inflammatory bowel disease Neg Hx   . Liver disease Neg Hx   . Pancreatic cancer Neg Hx   . Rectal cancer Neg Hx   . Colon polyps Neg Hx     Past Surgical History:  Procedure Laterality Date  . KNEE ARTHROSCOPY    . LIPOMA EXCISION Right 06/14/2017   Procedure: EXCISION SUBCUTANEOUS LIPOMA-LEFT CHEST WALL;  Surgeon: Donnie Mesa, MD;  Location: [facility];  Service: General;  Laterality: Right;   Social History   Occupational History  . Occupation: Glass blower/designer  Tobacco Use  . Smoking status: Current Every Day Smoker    Packs/day: 0.50    Types: Cigarettes  . Smokeless tobacco: Never Used  Substance and Sexual Activity  . Alcohol use: No  . Drug use: No  .  Sexual activity: Not on file

## 2019-06-01 ENCOUNTER — Telehealth: Payer: Self-pay | Admitting: Orthopedic Surgery

## 2019-06-01 NOTE — Telephone Encounter (Signed)
Pt called in returning laurens call, please give pt a call  [phone]

## 2019-06-01 NOTE — Telephone Encounter (Signed)
I have talked with patient at length.  He said he is on the verge of loosing his job due to inadequate information to his FMLA/STD company. I have generated new/ updated note. He is emailing me copy of form to complete and send back today.

## 2019-06-01 NOTE — Telephone Encounter (Signed)
Forms have been filled out with office notes attached.  I have faxed forms to listed number [phone].  I have also emailed forms to listed e-mail [email or web address] .  I will contact patient to let him know copies will be ready for pick up at office for his records.

## 2019-06-01 NOTE — Telephone Encounter (Signed)
Patient called. Says he need a note for his job. Stating how long he will be out, with dates on. His restrictions as well. his call back number is [phone]

## 2019-06-01 NOTE — Telephone Encounter (Signed)
Patient came in to pick up paper work that was prepared for him by Ander Purpura.   There was a typo made that incorrectly stated his age as 49 years old. He needs that mistake fixed as he fears it may make his claim look fraudulent. He left the original copy at the front desk with me, Jethro Bolus.   Call back number: [phone]

## 2019-06-01 NOTE — Telephone Encounter (Signed)
Tried calling patient to discuss, wen straight to VM. LMVM for him to call back to further discuss.

## 2019-06-01 NOTE — Telephone Encounter (Signed)
Please advise. Thanks.  

## 2019-06-04 NOTE — Telephone Encounter (Signed)
Tried calling pt. No answer. LMVM advising OV note had been corrected.

## 2019-06-04 NOTE — Telephone Encounter (Signed)
Please see below patient is referring to his OV note from 05/25/19.  Please make addendum and advise once complete. Thanks.

## 2019-06-22 ENCOUNTER — Ambulatory Visit (INDEPENDENT_AMBULATORY_CARE_PROVIDER_SITE_OTHER): Payer: BC Managed Care – PPO | Admitting: Orthopedic Surgery

## 2019-06-22 ENCOUNTER — Encounter: Payer: Self-pay | Admitting: Orthopedic Surgery

## 2019-06-22 ENCOUNTER — Other Ambulatory Visit: Payer: Self-pay

## 2019-06-22 DIAGNOSIS — S83241D Other tear of medial meniscus, current injury, right knee, subsequent encounter: Secondary | ICD-10-CM

## 2019-06-22 NOTE — Progress Notes (Signed)
Post-Op Visit Note   Patient: Daniel Russell           Date of Birth: 08/31/1970           MRN: [id number] Visit Date: 06/22/2019 PCP: [name], MD   Assessment & Plan:  Chief Complaint:  Chief Complaint  Patient presents with  . Right Knee - Follow-up   Visit Diagnoses:  1. Acute medial meniscus tear of right knee, subsequent encounter     Plan: Patient is a 49 year old male who presents s/p right knee arthroscopy with meniscal debridement on 04/30/2019.  Patient states that he is continuing to improve and notes that overall he is "at 75%".  He has good days and bad days and on his bad days his knee has significant swelling and he has to take oxycodone and keep weight off of his right knee.  He localizes the majority of his pain to the posterior aspect and states that this is worse with squatting down.  He has been doing a home exercise program consisting of straight leg raises and knee range of motion exercises since the surgery.  His incisions are healing well.  On exam he has 0 to 5 degrees of extension with 100 degrees of flexion.  He does have a significant effusion as well as some quad atrophy compared with the contralateral side.  No calf tenderness and negative Bevelyn Buckles' sign on exam today.  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  Patient is doing well but still needs some time in order to get back to his normal baseline.  Aspirated the right knee joint of about 40 cc of fluid and injected the joint with Toradol.  Plan for patient to follow-up on July 20, 2019 for further evaluation and we will determine his back to work status at that time.  I think that he will likely be able to return to work at that point.  Patient agrees with plan and will follow up on 07/20/2019.  Follow-Up Instructions: No follow-ups on file.   Orders:  No orders of the defined types were placed in this encounter.  No orders of the defined types were placed in this  encounter.   Imaging: No results found.  PMFS History: Patient Active Problem List   Diagnosis Date Noted  . Abdominal pain, epigastric 04/15/2018  . Change in bowel habit 04/15/2018  . Constipation 04/15/2018   Past Medical History:  Diagnosis Date  . Asthma    as a child  . Back pain   . Heart murmur   . Lipoma of chest wall    left chest  . Pre-diabetes   . Ulcer of the stomach and intestine     Family History  Problem Relation Age of Onset  . Breast cancer Mother   . Hypertension Mother   . Lung cancer Father   . Diabetes Maternal Grandmother   . Diabetes Maternal Grandfather   . Diabetes Paternal Grandmother   . Lung cancer Paternal Aunt   . Stomach cancer Paternal Uncle   . Colon cancer Neg Hx   . Esophageal cancer Neg Hx   . Inflammatory bowel disease Neg Hx   . Liver disease Neg Hx   . Pancreatic cancer Neg Hx   . Rectal cancer Neg Hx   . Colon polyps Neg Hx     Past Surgical History:  Procedure Laterality Date  . KNEE ARTHROSCOPY    . LIPOMA EXCISION Right 06/14/2017  Procedure: EXCISION SUBCUTANEOUS LIPOMA-LEFT CHEST WALL;  Surgeon: Donnie Mesa, MD;  Location: [facility];  Service: General;  Laterality: Right;   Social History   Occupational History  . Occupation: Glass blower/designer  Tobacco Use  . Smoking status: Current Every Day Smoker    Packs/day: 0.50    Types: Cigarettes  . Smokeless tobacco: Never Used  Substance and Sexual Activity  . Alcohol use: No  . Drug use: No  . Sexual activity: Not on file

## 2019-07-20 ENCOUNTER — Other Ambulatory Visit: Payer: Self-pay

## 2019-07-20 ENCOUNTER — Ambulatory Visit (INDEPENDENT_AMBULATORY_CARE_PROVIDER_SITE_OTHER): Payer: BC Managed Care – PPO | Admitting: Orthopedic Surgery

## 2019-07-20 ENCOUNTER — Encounter: Payer: Self-pay | Admitting: Orthopedic Surgery

## 2019-07-20 VITALS — Ht 70.0 in | Wt 211.0 lb

## 2019-07-20 DIAGNOSIS — S83241D Other tear of medial meniscus, current injury, right knee, subsequent encounter: Secondary | ICD-10-CM

## 2019-07-20 NOTE — Progress Notes (Signed)
   Post-Op Visit Note   Patient: Daniel Russell           Date of Birth: [birth year]           MRN: [id number] Visit Date: 07/20/2019 PCP: [name], MD   Assessment & Plan:  Chief Complaint:  Chief Complaint  Patient presents with  . Right Knee - Follow-up   Visit Diagnoses:  1. Acute medial meniscus tear of right knee, subsequent encounter     Plan: Patient is a 49 year old male who presents s/p right knee arthroscopy with meniscal debridement on 04/30/2019.  Patient notes that he is doing very well.  He had his right knee aspirated and injected with Toradol on 06/22/2019.  He notes his ready to return to work.  He only has occasional pain for which he takes baclofen.  He is not taking any oxycodone anymore.  On exam he has a trace effusion with 0 degrees of extension and greater than 120 degrees of flexion.  Incisions have healed well.  Work note was provided for return to work on 07/29/2019.  He will follow-up with the office as needed.  Follow-Up Instructions: No follow-ups on file.   Orders:  No orders of the defined types were placed in this encounter.  No orders of the defined types were placed in this encounter.   Imaging: No results found.  PMFS History: Patient Active Problem List   Diagnosis Date Noted  . Abdominal pain, epigastric 04/15/2018  . Change in bowel habit 04/15/2018  . Constipation 04/15/2018   Past Medical History:  Diagnosis Date  . Asthma    as a child  . Back pain   . Heart murmur   . Lipoma of chest wall    left chest  . Pre-diabetes   . Ulcer of the stomach and intestine     Family History  Problem Relation Age of Onset  . Breast cancer Mother   . Hypertension Mother   . Lung cancer Father   . Diabetes Maternal Grandmother   . Diabetes Maternal Grandfather   . Diabetes Paternal Grandmother   . Lung cancer Paternal Aunt   . Stomach cancer Paternal Uncle   . Colon cancer Neg Hx   . Esophageal cancer Neg Hx   . Inflammatory  bowel disease Neg Hx   . Liver disease Neg Hx   . Pancreatic cancer Neg Hx   . Rectal cancer Neg Hx   . Colon polyps Neg Hx     Past Surgical History:  Procedure Laterality Date  . KNEE ARTHROSCOPY    . LIPOMA EXCISION Right 06/14/2017   Procedure: EXCISION SUBCUTANEOUS LIPOMA-LEFT CHEST WALL;  Surgeon: Donnie Mesa, MD;  Location: [facility];  Service: General;  Laterality: Right;   Social History   Occupational History  . Occupation: Glass blower/designer  Tobacco Use  . Smoking status: Current Every Day Smoker    Packs/day: 0.50    Types: Cigarettes  . Smokeless tobacco: Never Used  Substance and Sexual Activity  . Alcohol use: No  . Drug use: No  . Sexual activity: Not on file

## 2019-08-07 ENCOUNTER — Telehealth: Payer: Self-pay | Admitting: Orthopedic Surgery

## 2019-08-07 NOTE — Telephone Encounter (Signed)
Called patient left message to return call to schedule an appointment with Dr Marlou Sa for knee pain

## 2019-08-07 NOTE — Telephone Encounter (Signed)
Returned call to  patient left message to return call again to schedule and appointment with Dr Marlou Sa for knee pain

## 2019-08-07 NOTE — Telephone Encounter (Signed)
Looks like you were trying to reach patient to get appt scheduled per pervious note in chart.

## 2019-08-07 NOTE — Telephone Encounter (Signed)
Pt called in said he just received a missed call from someone here? Did you happen to call the pt?  [phone]

## 2019-09-18 ENCOUNTER — Ambulatory Visit (INDEPENDENT_AMBULATORY_CARE_PROVIDER_SITE_OTHER): Payer: BLUE CROSS/BLUE SHIELD | Admitting: Surgical

## 2019-09-18 ENCOUNTER — Other Ambulatory Visit: Payer: Self-pay

## 2019-09-18 ENCOUNTER — Ambulatory Visit: Payer: Self-pay

## 2019-09-18 ENCOUNTER — Encounter: Payer: Self-pay | Admitting: Surgical

## 2019-09-18 DIAGNOSIS — M1711 Unilateral primary osteoarthritis, right knee: Secondary | ICD-10-CM | POA: Diagnosis not present

## 2019-09-18 DIAGNOSIS — M25561 Pain in right knee: Secondary | ICD-10-CM | POA: Diagnosis not present

## 2019-09-18 MED ORDER — MELOXICAM 15 MG PO TABS: 15.0000 mg | ORAL_TABLET | Freq: Every day | ORAL | 0 refills | Status: DC

## 2019-09-18 MED ORDER — BUPIVACAINE HCL 0.25 % IJ SOLN
4.0000 mL | INTRAMUSCULAR | Status: AC | PRN
  Administered 2019-09-18: 4 mL via INTRA_ARTICULAR

## 2019-09-18 MED ORDER — LIDOCAINE HCL 1 % IJ SOLN
5.0000 mL | INTRAMUSCULAR | Status: AC | PRN
  Administered 2019-09-18: 5 mL

## 2019-09-18 MED ORDER — METHYLPREDNISOLONE ACETATE 40 MG/ML IJ SUSP
40.0000 mg | INTRAMUSCULAR | Status: AC | PRN
  Administered 2019-09-18: 40 mg via INTRA_ARTICULAR

## 2019-09-18 NOTE — Progress Notes (Signed)
Office Visit Note   Patient: Daniel Russell           Date of Birth: 01-28-1971           MRN: [id number] Visit Date: 09/18/2019 Requested by: [name], MD [place],  [place] [id number] PCP: [name], MD  Subjective: Chief Complaint  Patient presents with  . Right Knee - Pain    HPI: Daniel Russell is a 49 y.o. male who presents to the office complaining of right knee pain.  He notes increasing knee pain with swelling over the last several weeks.  He describes this as a "occupational hazard".  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  He localizes pain to the medial aspect of his right knee.  He denies any groin pain or low back pain.  He does have some radicular left buttock pain down to his left knee but this does not seem to be a major concern compared with the right knee.  Denies any numbness or tingling down his legs.  He does have some clicking and popping in his right knee but denies any frank locking of the knee.  Denies any instability.  He has been taking meloxicam which he receives from his wife as well as baclofen which has provided good relief.  He has a history of right knee medial meniscectomy on 04/30/2019.  MRI from October of last year shows that he had some medial sided mild to moderate chondral damage at time of scan..                ROS:  All systems reviewed are negative as they relate to the chief complaint within the history of present illness.  Patient denies fevers or chills.  Assessment & Plan: Visit Diagnoses:  1. Unilateral primary osteoarthritis, right knee   2. Acute pain of right knee     Plan: Patient is a 49 year old male presents complaining of right knee pain.  On exam he has medial joint line tenderness as well as a small effusion of the right knee.  Radiographs taken today show mild joint space narrowing of the medial compartment without any fracture or dislocation.  Discussed  options.  Will prescribe meloxicam as he has had good relief taking this recently.  Also will try right knee injection with cortisone.  Patient tolerated the procedure well.  Follow-up as needed if pain does not improve.  Follow-Up Instructions: No follow-ups on file.   Orders:  Orders Placed This Encounter  Procedures  . XR Knee Complete 4 Views Right   Meds ordered this encounter  Medications  . meloxicam (MOBIC) 15 MG tablet    Sig: Take 1 tablet (15 mg total) by mouth daily.    Dispense:  30 tablet    Refill:  0      Procedures: Large Joint Inj: R knee on 09/18/2019 3:41 PM Indications: diagnostic evaluation, joint swelling and pain Details: 18 G 1.5 in needle, superolateral approach  Arthrogram: No  Medications: 5 mL lidocaine 1 %; 40 mg methylPREDNISolone acetate 40 MG/ML; 4 mL bupivacaine 0.25 % Outcome: tolerated well, no immediate complications Procedure, treatment alternatives, risks and benefits explained, specific risks discussed. Consent was given by the patient. Immediately prior to procedure a time out was called to verify the correct patient, procedure, equipment, support staff and site/side marked as required. Patient was prepped and draped in the usual sterile fashion.  Clinical Data: No additional findings.  Objective: Vital Signs: There were no vitals taken for this visit.  Physical Exam:  Constitutional: Patient appears well-developed HEENT:  Head: Normocephalic Eyes:EOM are normal Neck: Normal range of motion Cardiovascular: Normal rate Pulmonary/chest: Effort normal Neurologic: Patient is alert Skin: Skin is warm Psychiatric: Patient has normal mood and affect  Ortho Exam:  Right knee Exam Small effusion Tender to palpation of the medial joint line Extensor mechanism intact No TTP over the  lateral jointlines, quad tendon, patellar tendon, pes anserinus, patella, tibial tubercle, LCL/MCL insertions Stable to varus/valgus stresses.   Stable to anterior/posterior drawer Extension to 0 degrees Flexion > 90 degrees No pain with hip internal rotation.  Negative straight leg raise.  5/5 motor strength of the bilateral hip flexors, quadriceps, hamstring, dorsiflexion, plantarflexion.  Sensation intact through all dermatomes of the bilateral lower extremities.  Specialty Comments:  No specialty comments available.  Imaging: No results found.   PMFS History: Patient Active Problem List   Diagnosis Date Noted  . Abdominal pain, epigastric 04/15/2018  . Change in bowel habit 04/15/2018  . Constipation 04/15/2018   Past Medical History:  Diagnosis Date  . Asthma    as a child  . Back pain   . Heart murmur   . Lipoma of chest wall    left chest  . Pre-diabetes   . Ulcer of the stomach and intestine     Family History  Problem Relation Age of Onset  . Breast cancer Mother   . Hypertension Mother   . Lung cancer Father   . Diabetes Maternal Grandmother   . Diabetes Maternal Grandfather   . Diabetes Paternal Grandmother   . Lung cancer Paternal Aunt   . Stomach cancer Paternal Uncle   . Colon cancer Neg Hx   . Esophageal cancer Neg Hx   . Inflammatory bowel disease Neg Hx   . Liver disease Neg Hx   . Pancreatic cancer Neg Hx   . Rectal cancer Neg Hx   . Colon polyps Neg Hx     Past Surgical History:  Procedure Laterality Date  . KNEE ARTHROSCOPY    . LIPOMA EXCISION Right 06/14/2017   Procedure: EXCISION SUBCUTANEOUS LIPOMA-LEFT CHEST WALL;  Surgeon: Donnie Mesa, MD;  Location: [facility];  Service: General;  Laterality: Right;   Social History   Occupational History  . Occupation: Glass blower/designer  Tobacco Use  . Smoking status: Current Every Day Smoker    Packs/day: 0.50    Types: Cigarettes  . Smokeless tobacco: Never Used  Substance and Sexual Activity  . Alcohol use: No  . Drug use: No  . Sexual activity: Not on file

## 2020-03-26 ENCOUNTER — Ambulatory Visit (INDEPENDENT_AMBULATORY_CARE_PROVIDER_SITE_OTHER): Payer: BLUE CROSS/BLUE SHIELD | Admitting: Orthopedic Surgery

## 2020-03-26 DIAGNOSIS — M1711 Unilateral primary osteoarthritis, right knee: Secondary | ICD-10-CM | POA: Diagnosis not present

## 2020-03-26 MED ORDER — MELOXICAM 15 MG PO TABS: 15.0000 mg | ORAL_TABLET | Freq: Every day | ORAL | 0 refills | Status: DC

## 2020-03-29 ENCOUNTER — Encounter: Payer: Self-pay | Admitting: Orthopedic Surgery

## 2020-03-29 DIAGNOSIS — M1711 Unilateral primary osteoarthritis, right knee: Secondary | ICD-10-CM

## 2020-03-29 MED ORDER — LIDOCAINE HCL 1 % IJ SOLN
5.0000 mL | INTRAMUSCULAR | Status: AC | PRN
  Administered 2020-03-29: 5 mL

## 2020-03-29 NOTE — Progress Notes (Signed)
Office Visit Note   Patient: Daniel Russell           Date of Birth: [birth year]           MRN: [id number] Visit Date: 03/26/2020 Requested by: [name], [address] [place],  [place] [address] PCP: [name], MD  Subjective: Chief Complaint  Patient presents with  . Right Knee - Edema    HPI: Daniel Russell is a 49 year old patient who underwent knee arthroscopy in the past.  Had aspiration injection in May.  He had right knee partial medial meniscectomy in December 2020.  No mechanical symptoms.  Does have a knee sleeve.              ROS: All systems reviewed are negative as they relate to the chief complaint within the history of present illness.  Patient denies  fevers or chills.   Assessment & Plan: Visit Diagnoses:  1. Unilateral primary osteoarthritis, right knee     Plan: Impression is right knee effusion which is mild.  Patient would like the knee aspirated.  We will try Pennsaid samples along with Mobic.  Continue with nonweightbearing quad strengthening exercises and follow-up as needed.  Follow-Up Instructions: Return if symptoms worsen or fail to improve.   Orders:  No orders of the defined types were placed in this encounter.  Meds ordered this encounter  Medications  . meloxicam (MOBIC) 15 MG tablet    Sig: Take 1 tablet (15 mg total) by mouth daily.    Dispense:  30 tablet    Refill:  0      Procedures: Large Joint Inj: R knee on 03/29/2020 10:15 AM Indications: diagnostic evaluation, joint swelling and pain Details: 18 G 1.5 in needle, superolateral approach  Arthrogram: No  Medications: 5 mL lidocaine 1 % Outcome: tolerated well, no immediate complications Procedure, treatment alternatives, risks and benefits explained, specific risks discussed. Consent was given by the patient. Immediately prior to procedure a time out was called to verify the correct patient, procedure, equipment, support staff and site/side marked as required. Patient was  prepped and draped in the usual sterile fashion.       Clinical Data: No additional findings.  Objective: Vital Signs: There were no vitals taken for this visit.  Physical Exam:   Constitutional: Patient appears well-developed HEENT:  Head: Normocephalic Eyes:EOM are normal Neck: Normal range of motion Cardiovascular: Normal rate Pulmonary/chest: Effort normal Neurologic: Patient is alert Skin: Skin is warm Psychiatric: Patient has normal mood and affect    Ortho Exam: Ortho exam demonstrates full active and passive range of motion of the right knee.  Mild effusion is present.  Collateral crucial ligaments are stable.  Mild medial joint line tenderness is noted.  No other masses lymphadenopathy or skin changes noted in that right knee region.  Specialty Comments:  No specialty comments available.  Imaging: No results found.   PMFS History: Patient Active Problem List   Diagnosis Date Noted  . Abdominal pain, epigastric 04/15/2018  . Change in bowel habit 04/15/2018  . Constipation 04/15/2018   Past Medical History:  Diagnosis Date  . Asthma    as a child  . Back pain   . Heart murmur   . Lipoma of chest wall    left chest  . Pre-diabetes   . Ulcer of the stomach and intestine     Family History  Problem Relation Age of Onset  . Breast cancer Mother   . Hypertension Mother   .  Lung cancer Father   . Diabetes Maternal Grandmother   . Diabetes Maternal Grandfather   . Diabetes Paternal Grandmother   . Lung cancer Paternal Aunt   . Stomach cancer Paternal Uncle   . Colon cancer Neg Hx   . Esophageal cancer Neg Hx   . Inflammatory bowel disease Neg Hx   . Liver disease Neg Hx   . Pancreatic cancer Neg Hx   . Rectal cancer Neg Hx   . Colon polyps Neg Hx     Past Surgical History:  Procedure Laterality Date  . KNEE ARTHROSCOPY    . LIPOMA EXCISION Right 06/14/2017   Procedure: EXCISION SUBCUTANEOUS LIPOMA-LEFT CHEST WALL;  Surgeon: Donnie Mesa,  MD;  Location: [facility];  Service: General;  Laterality: Right;   Social History   Occupational History  . Occupation: Glass blower/designer  Tobacco Use  . Smoking status: Current Every Day Smoker    Packs/day: 0.50    Types: Cigarettes  . Smokeless tobacco: Never Used  Vaping Use  . Vaping Use: Never used  Substance and Sexual Activity  . Alcohol use: No  . Drug use: No  . Sexual activity: Not on file

## 2021-02-09 ENCOUNTER — Emergency Department (HOSPITAL_BASED_OUTPATIENT_CLINIC_OR_DEPARTMENT_OTHER): Payer: BC Managed Care – PPO | Admitting: Radiology

## 2021-02-09 ENCOUNTER — Emergency Department (HOSPITAL_BASED_OUTPATIENT_CLINIC_OR_DEPARTMENT_OTHER)
Admission: EM | Admit: 2021-02-09 | Discharge: 2021-02-09 | Disposition: A | Discharge status: Home / Self Care | Payer: BC Managed Care – PPO | Attending: Emergency Medicine | Admitting: Emergency Medicine

## 2021-02-09 ENCOUNTER — Encounter (HOSPITAL_BASED_OUTPATIENT_CLINIC_OR_DEPARTMENT_OTHER): Payer: Self-pay | Admitting: *Deleted

## 2021-02-09 ENCOUNTER — Other Ambulatory Visit: Payer: Self-pay

## 2021-02-09 DIAGNOSIS — Y990 Civilian activity done for income or pay: Secondary | ICD-10-CM | POA: Diagnosis not present

## 2021-02-09 DIAGNOSIS — W1839XA Other fall on same level, initial encounter: Secondary | ICD-10-CM | POA: Diagnosis not present

## 2021-02-09 DIAGNOSIS — S59902A Unspecified injury of left elbow, initial encounter: Secondary | ICD-10-CM | POA: Diagnosis present

## 2021-02-09 DIAGNOSIS — R221 Localized swelling, mass and lump, neck: Secondary | ICD-10-CM

## 2021-02-09 DIAGNOSIS — F17210 Nicotine dependence, cigarettes, uncomplicated: Secondary | ICD-10-CM | POA: Diagnosis not present

## 2021-02-09 DIAGNOSIS — J45909 Unspecified asthma, uncomplicated: Secondary | ICD-10-CM | POA: Diagnosis not present

## 2021-02-09 DIAGNOSIS — S5002XA Contusion of left elbow, initial encounter: Secondary | ICD-10-CM | POA: Diagnosis not present

## 2021-02-09 MED ORDER — PREDNISONE 50 MG PO TABS: 50.0000 mg | ORAL_TABLET | Freq: Every day | ORAL | 0 refills | Status: DC

## 2021-02-09 NOTE — ED Notes (Signed)
Patient verbalizes understanding of discharge instructions. Opportunity for questioning and answers were provided. Patient discharged from ED.  °

## 2021-02-09 NOTE — ED Triage Notes (Signed)
Pt stepped off lift this morning and fell to floor, No LOC, injury to left elbow, also for a couple of weeks small lump on post neck , tender on palpation. Reports numbness to left face since lump has appeared.

## 2021-02-09 NOTE — Discharge Instructions (Addendum)
You are seen in the ER after you had a fall.  The x-ray is negative for any fractures.  The treatment of choice will be RICE -in your case, given that you cannot take ibuprofen and also report a secondary area in the neck that could be possibly inflamed, we will put you on prednisone just for 3 days.  Take any kind of over-the-counter antacid medication like Zantac or omeprazole with it.  The neck nodule could be spasm or a cyst. We recommend that you ice the area aggressively for 15 minutes 3 or 4 times a day.  We also recommend stretching exercises.  If the symptoms continue to be severe and /or the nodule is getting larger -please discuss it with your PCP.  At that point they might consider advanced imaging or physical therapy.

## 2021-02-09 NOTE — ED Provider Notes (Signed)
[facility] EMERGENCY DEPT Provider Note   CSN: [id number] Arrival date & time: 02/09/21  [id number]     History Chief Complaint  Patient presents with   Fall   Elbow Injury   lump post neck    Daniel Russell is a 50 y.o. male.  HPI     50 year old male comes in with chief complaint of fall and elbow injury.  He is also complaining of lump in the posterior aspect of his neck.  Patient reports that he was at work and simply tripped and fell.  He took 2 steps and went down.  He does not even know if his leg gave out.  He definitely did not pass out.  He fell and struck his left elbow and is having pain over that area.  He denies any head trauma and has no headaches, focal numbness, weakness, tingling, vision change, nausea after the event.  He is also denying any neck pain.  Patient does indicate that he has been having some pain over the left neck for the last 2 weeks.  He also has had numbness of the left side of the face for the duration of the lump.  He is wondering if that is all related.  He has no medical problems and denies any illicit drug use, heavy drinking and smokes few cigarettes a day.  The pain over his neck is worse with certain movement.  He denies any trauma to the neck.  He has no neck pain besides the focal pain over the lump.  He was at the urgent care and was advised that he could have folliculitis. Past Medical History:  Diagnosis Date   Asthma    as a child   Back pain    Heart murmur    Lipoma of chest wall    left chest   Pre-diabetes    Ulcer of the stomach and intestine     Patient Active Problem List   Diagnosis Date Noted   Abdominal pain, epigastric 04/15/2018   Change in bowel habit 04/15/2018   Constipation 04/15/2018    Past Surgical History:  Procedure Laterality Date   KNEE ARTHROSCOPY     LIPOMA EXCISION Right 06/14/2017   Procedure: EXCISION SUBCUTANEOUS LIPOMA-LEFT CHEST WALL;  Surgeon: Donnie Mesa, MD;  Location:  [facility];  Service: General;  Laterality: Right;       Family History  Problem Relation Age of Onset   Breast cancer Mother    Hypertension Mother    Lung cancer Father    Diabetes Maternal Grandmother    Diabetes Maternal Grandfather    Diabetes Paternal Grandmother    Lung cancer Paternal Aunt    Stomach cancer Paternal Uncle    Colon cancer Neg Hx    Esophageal cancer Neg Hx    Inflammatory bowel disease Neg Hx    Liver disease Neg Hx    Pancreatic cancer Neg Hx    Rectal cancer Neg Hx    Colon polyps Neg Hx     Social History   Tobacco Use   Smoking status: Every Day    Packs/day: 0.50    Types: Cigarettes   Smokeless tobacco: Never  Vaping Use   Vaping Use: Never used  Substance Use Topics   Alcohol use: No   Drug use: No    Home Medications Prior to Admission medications   Medication Sig Start Date End Date Taking? Authorizing Provider  predniSONE (DELTASONE) 50 MG tablet Take 1 tablet (  50 mg total) by mouth daily. 02/09/21  Yes Varney Biles, MD  albuterol (PROVENTIL HFA;VENTOLIN HFA) 108 (90 Base) MCG/ACT inhaler Inhale 1-2 puffs into the lungs every 6 (six) hours as needed for wheezing or shortness of breath. 12/27/17   Petrucelli, Samantha R, PA-C  APPLE CIDER VINEGAR PO Take by mouth. Takes 2 gummies daily    [provider]  celecoxib (CELEBREX) 200 MG capsule Take 1 capsule (200 mg total) by mouth 2 (two) times daily as needed. 12/19/18   Hilts, Legrand Como, MD  cyclobenzaprine (FLEXERIL) 10 MG tablet Take 1 tablet (10 mg total) by mouth 2 (two) times daily as needed for up to 15 doses for muscle spasms. 09/06/18   Curatolo, Adam, DO  diclofenac sodium (VOLTAREN) 1 % GEL Apply topically 4 (four) times daily.    [provider]  docusate sodium (COLACE) 100 MG capsule Take 1 capsule (100 mg total) by mouth 2 (two) times daily. 09/28/18   Mansouraty, Telford Nab., MD  HYDROcodone-acetaminophen (NORCO/VICODIN) 5-325 MG tablet Take  1-2 tablets by mouth every 6 (six) hours as needed. Patient not taking: Reported on 07/20/2019 11/25/18   Muthersbaugh, Jarrett Soho, PA-C  meloxicam (MOBIC) 15 MG tablet Take 1 tablet (15 mg total) by mouth daily. 03/26/20   Meredith Pel, MD  methocarbamol (ROBAXIN) 500 MG tablet Take 1 tablet (500 mg total) by mouth every 8 (eight) hours as needed. Patient not taking: Reported on 07/20/2019 04/30/19   Magnant, Gerrianne Scale, PA-C  omeprazole (PRILOSEC) 40 MG capsule Take 1 capsule (40 mg total) by mouth 2 (two) times a day. Patient taking differently: Take 40 mg by mouth 2 (two) times a day. Pt will start 40mg  twice daily, after completion of Pylera treatment. 09/28/18   Mansouraty, Telford Nab., MD  oxyCODONE-acetaminophen (PERCOCET/ROXICET) 5-325 MG tablet TAKE 1 TO 2 TABLETS BY MOUTH EVERY 6 HOURS AS NEEDED FOR PAIN FOR UP TO 3 DAYS 12/24/18   [provider]  polycarbophil (FIBERCON) 625 MG tablet Take 2 tablets (1,250 mg total) by mouth daily. 09/28/18   Mansouraty, Telford Nab., MD  polyethylene glycol powder (GLYCOLAX/MIRALAX) 17 GM/SCOOP powder Take 255 g by mouth 3 (three) times daily. 09/28/18   Mansouraty, Telford Nab., MD    Allergies    Patient has no known allergies.  Review of Systems   Review of Systems  Constitutional:  Positive for activity change.  Musculoskeletal:  Positive for arthralgias.  Neurological:  Positive for numbness.  Hematological:  Does not bruise/bleed easily.  All other systems reviewed and are negative.  Physical Exam Updated Vital Signs BP 115/74 (BP Location: Right Arm)   Pulse 60   Temp 98.1 F (36.7 C) (Oral)   Resp 14   Ht 5\' 10"  (1.778 m)   Wt 86.8 kg   SpO2 100%   BMI 27.46 kg/m   Physical Exam Vitals and nursing note reviewed.  Constitutional:      Appearance: He is well-developed.  HENT:     Head: Atraumatic.  Neck:     Comments: No midline c-spine tenderness, pt able to turn head to 45 degrees bilaterally without any pain and  able to flex neck to the chest and extend without any pain or neurologic symptoms.  Patient does have a small nodule over the left neck, just anterior to the trapezius muscle, the lesion is mobile Cardiovascular:     Rate and Rhythm: Normal rate.  Pulmonary:     Effort: Pulmonary effort is normal.  Musculoskeletal:  General: Tenderness present. No swelling.     Cervical back: Neck supple. No rigidity.     Comments: Medial elbow tenderness  Skin:    General: Skin is warm.  Neurological:     Mental Status: He is alert and oriented to person, place, and time.    ED Results / Procedures / Treatments   Labs (all labs ordered are listed, but only abnormal results are displayed) Labs Reviewed - No data to display  EKG None  Radiology DG Elbow Complete Left  Result Date: 02/09/2021 CLINICAL DATA:  50 year old male with history of trauma from a fall with injury to the left elbow. EXAM: LEFT ELBOW - COMPLETE 3+ VIEW COMPARISON:  No priors. FINDINGS: There is no evidence of fracture, dislocation, or joint effusion. There is no evidence of arthropathy or other focal bone abnormality. Soft tissues are unremarkable. IMPRESSION: Negative. Electronically Signed   By: Vinnie Langton M.D.   On: 02/09/2021 07:40    Procedures Procedures   Medications Ordered in ED Medications - No data to display  ED Course  I have reviewed the triage vital signs and the nursing notes.  Pertinent labs & imaging results that were available during my care of the patient were reviewed by me and considered in my medical decision making (see chart for details).    MDM Rules/Calculators/A&P                           Patient comes in primarily with chief complaint of fall and elbow injury.  His range of motion of the elbow is completely normal.  No gross swelling.  X-ray ordered to make sure there is no avulsion type fracture given the focal pain.  If the x-ray is negative then patient likely has  contusion.  Additionally is complaining of nodule over the left neck.  Associated with that his facial numbness.  Symptoms have been present for 2 weeks.  No progression of the symptoms, but the neck nodule has been painful with certain activities.  Patient has history of Bell's palsy, which did cause numbness.  This time he does not have any facial drooping.  He has no risk factors besides smoking for his stroke and his neuro exam is otherwise nonfocal.  He tells me that when the pain flares up over his neck, the numbness gets worse.  Not sure what to make of this nodule that I can appreciate.  It is mobile and it is tender.  I suspect it is a cyst but other possibilities include nerve tumor, lymph node, mass that is irritating the nerve.  Really does not appear cancerous, which patient is mostly concerned about.  Patient is in agreement with conservative management until he seen by his PCP later this month.  If the symptoms persist then I advised him to discuss it with his PCP to see if advanced imaging or even a referral is indicated.  Final Clinical Impression(s) / ED Diagnoses Final diagnoses:  Contusion of left elbow, initial encounter  Neck nodule    Rx / DC Orders ED Discharge Orders          Ordered    predniSONE (DELTASONE) 50 MG tablet  Daily        02/09/21 0804             Varney Biles, MD 02/09/21 [phone]

## 2021-05-18 ENCOUNTER — Emergency Department (HOSPITAL_BASED_OUTPATIENT_CLINIC_OR_DEPARTMENT_OTHER): Payer: Self-pay | Admitting: Radiology

## 2021-05-18 ENCOUNTER — Encounter (HOSPITAL_BASED_OUTPATIENT_CLINIC_OR_DEPARTMENT_OTHER): Payer: Self-pay

## 2021-05-18 ENCOUNTER — Other Ambulatory Visit: Payer: Self-pay

## 2021-05-18 ENCOUNTER — Emergency Department (HOSPITAL_BASED_OUTPATIENT_CLINIC_OR_DEPARTMENT_OTHER)
Admission: EM | Admit: 2021-05-18 | Discharge: 2021-05-18 | Disposition: A | Discharge status: Home / Self Care | Payer: Self-pay | Attending: Emergency Medicine | Admitting: Emergency Medicine

## 2021-05-18 DIAGNOSIS — M5432 Sciatica, left side: Secondary | ICD-10-CM | POA: Insufficient documentation

## 2021-05-18 DIAGNOSIS — M5442 Lumbago with sciatica, left side: Secondary | ICD-10-CM

## 2021-05-18 DIAGNOSIS — J45909 Unspecified asthma, uncomplicated: Secondary | ICD-10-CM | POA: Insufficient documentation

## 2021-05-18 MED ORDER — IBUPROFEN 600 MG PO TABS: 600.0000 mg | ORAL_TABLET | Freq: Four times a day (QID) | ORAL | 0 refills | Status: DC | PRN

## 2021-05-18 MED ORDER — KETOROLAC TROMETHAMINE 60 MG/2ML IM SOLN
30.0000 mg | Freq: Once | INTRAMUSCULAR | Status: AC
  Administered 2021-05-18: 30 mg via INTRAMUSCULAR
  Filled 2021-05-18: qty 2

## 2021-05-18 MED ORDER — METHOCARBAMOL 500 MG PO TABS
500.0000 mg | ORAL_TABLET | Freq: Once | ORAL | Status: AC
  Administered 2021-05-18: 500 mg via ORAL
  Filled 2021-05-18: qty 1

## 2021-05-18 MED ORDER — METHOCARBAMOL 500 MG PO TABS: 500.0000 mg | ORAL_TABLET | Freq: Three times a day (TID) | ORAL | 0 refills | Status: DC | PRN

## 2021-05-18 NOTE — Discharge Instructions (Addendum)
You were seen today for evaluation of your back pain.  Your x-ray of your lumbar shows some right sided degenerative changes mildly, but nothing acute that should be causing your pain.  This is likely musculoskeletal in nature.  Because of this, I will be prescribing you ibuprofen 600 mg take every 6 hours as needed for pain.  Additionally, I will prescribing you Robaxin, a muscle laxer, to take as needed for pain.  Please do not take if operating heavy machinery or driving as this can make you sleepy.  You can continue to apply heat or ice to the area for comfort.  I have also given you a note for a few days off of work to rest.  If you have any urinary incontinence, fecal incontinence, urinary retention, fevers, numbness, tingling, or weakness to the leg, please return to the nearest emergency department for reevaluation.

## 2021-05-18 NOTE — ED Provider Notes (Signed)
[facility] EMERGENCY DEPT Provider Note   CSN: [id number] Arrival date & time: 05/18/21  1110     History Chief Complaint  Patient presents with   Back Pain    Daniel Russell is a 51 y.o. male presents emergency department for evaluation of left-sided lower back pain after lifting something heavy at work.  Patient reports he felt the back pain immediately after lifting something heavy.  He reports the pain radiates into his left lower quadrant/groin.  Patient reports his pain was relieved with a hot compress yesterday on his back.  Patient reports pain increases with walking or lying on his left side. Denies any additional trauma or fall or MVC to the area.  Patient denies any pain like this before or any previous injury to the area.  He denies any numbness or tingling or weakness down the leg.  Denies any urinary fecal incontinence.  Denies any urinary retention.  Denies any fever.  Denies any urinary or genital complaints.  Medical history of asthma, no surgical history.  Denies any daily medications.  No known drug allergies.  Daily tobacco user.  Denies any EtOH or drug use.  Denies any IV drug use ever.   Back Pain Associated symptoms: no abdominal pain, no chest pain, no dysuria, no fever, no numbness and no weakness       Home Medications Prior to Admission medications   Medication Sig Start Date End Date Taking? Authorizing Provider  ibuprofen (ADVIL) 600 MG tablet Take 1 tablet (600 mg total) by mouth every 6 (six) hours as needed. 05/18/21  Yes Sherrell Puller, PA-C  albuterol (PROVENTIL HFA;VENTOLIN HFA) 108 (90 Base) MCG/ACT inhaler Inhale 1-2 puffs into the lungs every 6 (six) hours as needed for wheezing or shortness of breath. 12/27/17   Petrucelli, Samantha R, PA-C  APPLE CIDER VINEGAR PO Take by mouth. Takes 2 gummies daily    [provider]  diclofenac sodium (VOLTAREN) 1 % GEL Apply topically 4 (four) times daily.    [provider]   docusate sodium (COLACE) 100 MG capsule Take 1 capsule (100 mg total) by mouth 2 (two) times daily. 09/28/18   Mansouraty, Telford Nab., MD  HYDROcodone-acetaminophen (NORCO/VICODIN) 5-325 MG tablet Take 1-2 tablets by mouth every 6 (six) hours as needed. Patient not taking: Reported on 07/20/2019 11/25/18   Muthersbaugh, Jarrett Soho, PA-C  meloxicam (MOBIC) 15 MG tablet Take 1 tablet (15 mg total) by mouth daily. 03/26/20   Meredith Pel, MD  methocarbamol (ROBAXIN) 500 MG tablet Take 1 tablet (500 mg total) by mouth every 8 (eight) hours as needed. 05/18/21   Sherrell Puller, PA-C  omeprazole (PRILOSEC) 40 MG capsule Take 1 capsule (40 mg total) by mouth 2 (two) times a day. Patient taking differently: Take 40 mg by mouth 2 (two) times a day. Pt will start 40mg  twice daily, after completion of Pylera treatment. 09/28/18   Mansouraty, Telford Nab., MD  oxyCODONE-acetaminophen (PERCOCET/ROXICET) 5-325 MG tablet TAKE 1 TO 2 TABLETS BY MOUTH EVERY 6 HOURS AS NEEDED FOR PAIN FOR UP TO 3 DAYS 12/24/18   [provider]  polycarbophil (FIBERCON) 625 MG tablet Take 2 tablets (1,250 mg total) by mouth daily. 09/28/18   Mansouraty, Telford Nab., MD  polyethylene glycol powder (GLYCOLAX/MIRALAX) 17 GM/SCOOP powder Take 255 g by mouth 3 (three) times daily. 09/28/18   Mansouraty, Telford Nab., MD  predniSONE (DELTASONE) 50 MG tablet Take 1 tablet (50 mg total) by mouth daily. 02/09/21   Varney Biles, MD  Allergies    Patient has no known allergies.    Review of Systems   Review of Systems  Constitutional:  Negative for chills and fever.  HENT:  Negative for ear pain and sore throat.   Eyes:  Negative for pain and visual disturbance.  Respiratory:  Negative for cough and shortness of breath.   Cardiovascular:  Negative for chest pain and palpitations.  Gastrointestinal:  Negative for abdominal pain and vomiting.  Genitourinary:  Negative for dysuria and hematuria.  Musculoskeletal:  Positive for  back pain. Negative for arthralgias.  Skin:  Negative for color change and rash.  Neurological:  Negative for seizures, syncope, weakness and numbness.  All other systems reviewed and are negative.  Physical Exam Updated Vital Signs BP 136/76    Pulse 87    Temp 97.6 F (36.4 C)    Resp 16    Ht 5\' 10"  (1.778 m)    Wt 86.8 kg    SpO2 98%    BMI 27.46 kg/m  Physical Exam Vitals and nursing note reviewed.  Constitutional:      General: He is not in acute distress.    Appearance: Normal appearance. He is not toxic-appearing.  Eyes:     General: No scleral icterus. Pulmonary:     Effort: Pulmonary effort is normal. No respiratory distress.  Musculoskeletal:        General: Tenderness present. No swelling, deformity or signs of injury. Normal range of motion.     Comments: No overlying skin changes other than tattoos noted to the area.  No step-offs or deformities noted or palpated.  No midline tenderness of the cervical, thoracic, lumbar, or sacrum.  He has some left-sided paraspinal tenderness palpation.  Positive straight leg raise on the left.  Negative contralateral straight leg.  Strength 5 out of 5 bilaterally.  Sensation equal throughout bilaterally.  DP and PT pulses intact.  Compartments are soft.  Skin:    General: Skin is dry.     Findings: No rash.  Neurological:     General: No focal deficit present.     Mental Status: He is alert. Mental status is at baseline.  Psychiatric:        Mood and Affect: Mood normal.    ED Results / Procedures / Treatments   Labs (all labs ordered are listed, but only abnormal results are displayed) Labs Reviewed - No data to display  EKG None  Radiology DG Lumbar Spine Complete  Result Date: 05/18/2021 CLINICAL DATA:  Acute lower back pain without known injury. EXAM: LUMBAR SPINE - COMPLETE 4+ VIEW COMPARISON:  April 26, 2020. FINDINGS: There is no evidence of lumbar spine fracture. Alignment is normal. Intervertebral disc spaces are  maintained. Mild degenerative disc disease is seen involving the right-sided posterior facet joints of L4-5 and L5-S1. IMPRESSION: Mild degenerative changes seen involving several right-sided posterior facet joints. No acute abnormality seen. Aortic Atherosclerosis (ICD10-I70.0). Electronically Signed   By: Marijo Conception M.D.   On: 05/18/2021 13:31    Procedures Procedures  Mildly elevated blood pressure, afebrile, normal heart rate, satting 98% on room air with no increased work of breath.  Medications Ordered in ED Medications  ketorolac (TORADOL) injection 30 mg (30 mg Intramuscular Given 05/18/21 1315)  methocarbamol (ROBAXIN) tablet 500 mg (500 mg Oral Given 05/18/21 1316)    ED Course/ Medical Decision Making/ A&P  Medical Decision Making 51 year old male presents emergency department for evaluation of left-sided back pain after lifting something heavy yesterday at work.  Differential diagnosis includes but is not limited to spinal fracture, sciatica, MSK, cauda equina, epidural abscess.  Vital signs show mildly elevated blood pressure, however the patient has a normal heart rate, afebrile, satting 98% on room air with no increased work of breath.  Physical exam is positive for some left paraspinal tenderness palpation with no overlying skin changes, deformities, or step-offs noted other than tattoos.  Positive straight leg raise on the left.  Sensation and strength intact.  Pulses intact.  Exam is likely consistent with MSK.  Low suspicion for epidural abscess as the patient does not have a fever and denies any IV drug use ever.  No track marks seen in ACs bilaterally.  Low suspicion for cauda equina as the patient has not had any incontinence or retention problems.  Denies any numbness, tingling, or weakness in the leg.  Patient is ambulatory.  Lumbar x-ray shows mild degenerative changes involving several of the right sided posterior facet joints.  Toradol Robaxin  given in the ED.  We will send patient home on ibuprofen 600 mg as well as some Robaxin.  Strict return precautions discussed.  Patient agrees with plan.  Patient is stable being discharged home in good condition.  Final Clinical Impression(s) / ED Diagnoses Final diagnoses:  Acute left-sided low back pain with left-sided sciatica    Rx / DC Orders ED Discharge Orders          Ordered    ibuprofen (ADVIL) 600 MG tablet  Every 6 hours PRN        05/18/21 1436    methocarbamol (ROBAXIN) 500 MG tablet  Every 8 hours PRN        05/18/21 1436              Sherrell Puller, PA-C 05/18/21 [place], [facility], MD 05/20/21 1705

## 2021-05-18 NOTE — ED Triage Notes (Signed)
Patient here POV from Home with Back Pain.  Patient complaining of Pain to Left Lower Back. Patient states Pain began yesterday. Patient loads and moves furniture but does not recall any Major Trauma or Injury.  Ambulatory. NAD Noted during Triage. A&Ox4. GCS 15.

## 2021-05-18 NOTE — ED Notes (Signed)
Pt teaching provided on medications that may cause drowsiness. Pt instructed not to drive or operate heavy machinery while taking the prescribed medication. Pt verbalized understanding.  ? ?Pt provided discharge instructions and prescription information. Pt was given the opportunity to ask questions and questions were answered. Discharge signature not obtained in the setting of the COVID-19 pandemic in order to reduce high touch surfaces.  ? ?

## 2021-09-21 ENCOUNTER — Ambulatory Visit: Payer: BC Managed Care – PPO | Admitting: Cardiology

## 2021-09-21 ENCOUNTER — Encounter: Payer: Self-pay | Admitting: Cardiology

## 2021-09-21 VITALS — BP 113/75 | HR 55 | Temp 98.0°F | Resp 16 | Ht 70.0 in | Wt 208.4 lb

## 2021-09-21 DIAGNOSIS — K219 Gastro-esophageal reflux disease without esophagitis: Secondary | ICD-10-CM

## 2021-09-21 DIAGNOSIS — E7800 Pure hypercholesterolemia, unspecified: Secondary | ICD-10-CM

## 2021-09-21 DIAGNOSIS — R7989 Other specified abnormal findings of blood chemistry: Secondary | ICD-10-CM

## 2021-09-21 DIAGNOSIS — I700 Atherosclerosis of aorta: Secondary | ICD-10-CM

## 2021-09-21 DIAGNOSIS — F17210 Nicotine dependence, cigarettes, uncomplicated: Secondary | ICD-10-CM

## 2021-09-21 MED ORDER — ATORVASTATIN CALCIUM 40 MG PO TABS: 40.0000 mg | ORAL_TABLET | Freq: Every day | ORAL | 0 refills | Status: AC

## 2021-09-21 MED ORDER — BUPROPION HCL ER (SR) 150 MG PO TB12: 150.0000 mg | ORAL_TABLET | Freq: Two times a day (BID) | ORAL | 2 refills | Status: AC

## 2021-09-21 MED ORDER — NICOTINE 7 MG/24HR TD PT24: 7.0000 mg | MEDICATED_PATCH | Freq: Every day | TRANSDERMAL | 0 refills | Status: DC

## 2021-09-21 MED ORDER — PANTOPRAZOLE SODIUM 40 MG PO TBEC: 40.0000 mg | DELAYED_RELEASE_TABLET | Freq: Every day | ORAL | 2 refills | Status: DC

## 2021-09-21 NOTE — Progress Notes (Signed)
? ?Primary Physician/Referring:  Bartholome Bill, MD ? ?Patient ID: Daniel Russell, male    DOB: 09-07-1970, 51 y.o.   MRN: [id number] ? ?Chief Complaint  ?Patient presents with  ? Chest Pain  ? New Patient (Initial Visit)  ?  Referred by Dr. Precious Haws  ? ?HPI:   ? ?Daniel Russell  is a 51 y.o. African-American male patient with tobacco use disorder, aortic atherosclerosis noted by recent CT scan of the chest for lung cancer screening, hyperlipidemia referred to me for evaluation of chest pain.  Chest pain symptoms occur only when he smokes cigarettes. He gets about 30-40,000 steps a day at workplace without any chest pain or shortness of breath.  He also endorses GERD.    ? ?Past Medical History:  ?Diagnosis Date  ? Asthma   ? as a child  ? Back pain   ? Heart murmur   ? Lipoma of chest wall   ? left chest  ? Pre-diabetes   ? Ulcer of the stomach and intestine   ? ?Past Surgical History:  ?Procedure Laterality Date  ? KNEE ARTHROSCOPY    ? LIPOMA EXCISION Right 06/14/2017  ? Procedure: EXCISION SUBCUTANEOUS LIPOMA-LEFT CHEST WALL;  Surgeon: Donnie Mesa, MD;  Location: [facility];  Service: General;  Laterality: Right;  ? ?Family History  ?Problem Relation Age of Onset  ? Breast cancer Mother   ? Hypertension Mother   ? Lung cancer Father   ? Diabetes Sister   ? Lung cancer Paternal Aunt   ? Stomach cancer Paternal Uncle   ? Diabetes Maternal Grandmother   ? Diabetes Maternal Grandfather   ? Diabetes Paternal Grandmother   ? Colon cancer Neg Hx   ? Esophageal cancer Neg Hx   ? Inflammatory bowel disease Neg Hx   ? Liver disease Neg Hx   ? Pancreatic cancer Neg Hx   ? Rectal cancer Neg Hx   ? Colon polyps Neg Hx   ?  ?Social History  ? ?Tobacco Use  ? Smoking status: Every Day  ?  Packs/day: 0.50  ?  Types: Cigarettes  ? Smokeless tobacco: Never  ?Substance Use Topics  ? Alcohol use: No  ? ?Marital Status: Married  ?ROS  ?Review of Systems  ?Cardiovascular:  Positive for chest pain.  Negative for dyspnea on exertion and leg swelling.  ?Gastrointestinal:  Positive for heartburn.  ?Objective  ?Blood pressure 113/75, pulse (!) 55, temperature 98 ?F (36.7 ?C), temperature source Temporal, resp. rate 16, height 5' 10"  (1.778 m), weight 208 lb 6.4 oz (94.5 kg), SpO2 97 %. Body mass index is 29.9 kg/m?.  ? ?  09/21/2021  ? 11:07 AM 05/18/2021  ?  2:36 PM 05/18/2021  ? 11:16 AM  ?Vitals with BMI  ?Height 5' 10"   5' 10"   ?Weight 208 lbs 6 oz  191 lbs 6 oz  ?BMI 29.9  27.46  ?Systolic 494 496 759  ?Diastolic 75 91 76  ?Pulse 55 59 87  ?  ?Physical Exam ?Neck:  ?   Vascular: No JVD.  ?Cardiovascular:  ?   Rate and Rhythm: Normal rate and regular rhythm.  ?   Pulses: Intact distal pulses.  ?   Heart sounds: Normal heart sounds. No murmur heard. ?  No gallop.  ?Pulmonary:  ?   Effort: Pulmonary effort is normal.  ?   Breath sounds: Normal breath sounds.  ?Abdominal:  ?   General: Bowel sounds are normal.  ?   Palpations:  Abdomen is soft.  ?Musculoskeletal:  ?   Right lower leg: No edema.  ?   Left lower leg: No edema.  ? ? ?Medications and allergies  ? ?Allergies  ?Allergen Reactions  ? Peanut-Containing Drug Products Shortness Of Breath  ? Pork Allergy Shortness Of Breath  ? Cheese   ?  Other reaction(s): Laryngeal Edema (ALLERGY) ?Ricotta  ?  ? ?Medication list after today's encounter  ? ?Current Outpatient Medications:  ?  albuterol (PROVENTIL HFA;VENTOLIN HFA) 108 (90 Base) MCG/ACT inhaler, Inhale 1-2 puffs into the lungs every 6 (six) hours as needed for wheezing or shortness of breath., Disp: 1 Inhaler, Rfl: 0 ?  buPROPion (WELLBUTRIN SR) 150 MG 12 hr tablet, Take 1 tablet (150 mg total) by mouth 2 (two) times daily. 1 tab daily for 3 days then twice daily, Disp: 60 tablet, Rfl: 2 ?  ibuprofen (ADVIL) 600 MG tablet, Take 1 tablet (600 mg total) by mouth every 6 (six) hours as needed., Disp: 30 tablet, Rfl: 0 ?  nicotine (NICODERM CQ) 7 mg/24hr patch, Place 1 patch (7 mg total) onto the skin daily., Disp: 28  patch, Rfl: 0 ?  nitroGLYCERIN (NITROSTAT) 0.4 MG SL tablet, Place 1 tablet under the tongue as needed., Disp: , Rfl:  ?  pantoprazole (PROTONIX) 40 MG tablet, Take 1 tablet (40 mg total) by mouth daily before breakfast., Disp: 30 tablet, Rfl: 2 ?  atorvastatin (LIPITOR) 40 MG tablet, Take 1 tablet (40 mg total) by mouth daily., Disp: 90 tablet, Rfl: 0 ? ?Laboratory examination:  ? ?External labs:  ? ?Labs 09/09/2021: ? ?Potassium 3.7, BUN 13, creatinine 0.92, EGFR >90 mL.  LFTs normal.  Serum glucose 83 mg. ? ?Total cholesterol 188, triglycerides 56, HDL 46, LDL 131.  Non-HDL cholesterol 142. ? ?Hb 14.6/HCT 43.7, platelets 204, normal indicis. ? ?TSH 05/01/2020: Reduced at 0.396. ? ?Radiology:  ? ?Low-dose CT scan of the chest 09/15/2021: ?Centrilobular emphysema and scattered tiny bilateral pulmonary nodules. ?Mild atherosclerotic calcification of the thoracic aorta. ? ?Cardiac Studies:  ?NA ? ?EKG:  ? ?EKG 09/21/2021: Sinus bradycardia at rate of 55 bpm, left atrial enlargement, otherwise normal EKG.   ? ?Assessment  ? ?  ICD-10-CM   ?1. Thoracic aortic atherosclerosis (HCC)  I70.0   ?  ?2. Gastroesophageal reflux disease without esophagitis  K21.9 EKG 12-Lead  ?  pantoprazole (PROTONIX) 40 MG tablet  ?  ?3. Abnormal TSH  R79.89   ?  ?4. Hypercholesteremia  E78.00 atorvastatin (LIPITOR) 40 MG tablet  ?  ?5. Cigarette nicotine dependence without complication  Z00.174 buPROPion (WELLBUTRIN SR) 150 MG 12 hr tablet  ?  nicotine (NICODERM CQ) 7 mg/24hr patch  ?  ?  ? ?Medications Discontinued During This Encounter  ?Medication Reason  ? 0.9 %  sodium chloride infusion   ? oxyCODONE-acetaminophen (PERCOCET/ROXICET) 5-325 MG tablet No longer needed (for PRN medications)  ? polycarbophil (FIBERCON) 625 MG tablet   ? polyethylene glycol powder (GLYCOLAX/MIRALAX) 17 GM/SCOOP powder   ? predniSONE (DELTASONE) 50 MG tablet Completed Course  ? APPLE CIDER VINEGAR PO   ? diclofenac sodium (VOLTAREN) 1 % GEL   ? docusate sodium  (COLACE) 100 MG capsule   ? HYDROcodone-acetaminophen (NORCO/VICODIN) 5-325 MG tablet   ? meloxicam (MOBIC) 15 MG tablet   ? methocarbamol (ROBAXIN) 500 MG tablet   ? omeprazole (PRILOSEC) 40 MG capsule   ? atorvastatin (LIPITOR) 10 MG tablet Reorder  ?  ?Meds ordered this encounter  ?Medications  ? pantoprazole (PROTONIX)  40 MG tablet  ?  Sig: Take 1 tablet (40 mg total) by mouth daily before breakfast.  ?  Dispense:  30 tablet  ?  Refill:  2  ? atorvastatin (LIPITOR) 40 MG tablet  ?  Sig: Take 1 tablet (40 mg total) by mouth daily.  ?  Dispense:  90 tablet  ?  Refill:  0  ?  Refills to Dr. Precious Haws  ? buPROPion (WELLBUTRIN SR) 150 MG 12 hr tablet  ?  Sig: Take 1 tablet (150 mg total) by mouth 2 (two) times daily. 1 tab daily for 3 days then twice daily  ?  Dispense:  60 tablet  ?  Refill:  2  ? nicotine (NICODERM CQ) 7 mg/24hr patch  ?  Sig: Place 1 patch (7 mg total) onto the skin daily.  ?  Dispense:  28 patch  ?  Refill:  0  ? ?Orders Placed This Encounter  ?Procedures  ? EKG 12-Lead  ? ?Recommendations:  ? ?Daniel Russell is a 51 y.o. African-American male patient with tobacco use disorder, aortic atherosclerosis noted by recent CT scan of the chest for lung cancer screening, hyperlipidemia referred to me for evaluation of chest pain.  Chest pain symptoms occur only when he smokes cigarettes.  Chest pain is not suggestive of angina pectoris.  He gets about 30-40,000 steps a day at workplace without any chest pain or shortness of breath.  He also endorses GERD.  I prescribed him Protonix 40 mg daily for 2 weeks followed by as needed use. ? ?In view of aortic atherosclerosis, needs aggressive lipid management, goal LDL closer to 70 if possible as he is only 51 years of age.  Increase Lipitor to 40 mg daily.  Needs lipids in 2 to 3 months and advised him to follow-up with Dr. Precious Haws. ? ?He appears to be motivated in smoking cessation, prescribed him Wellbutrin and also nicotine patches.  I spent  additional 8 minutes in discussions regarding effects of smoking on atherosclerosis and possible lung cancer. ? ?Upon review of his labs, previously he has had abnormal TSH, needs follow-up labs and again patient is aw

## 2021-10-22 ENCOUNTER — Other Ambulatory Visit: Payer: Self-pay

## 2021-10-22 ENCOUNTER — Emergency Department (HOSPITAL_BASED_OUTPATIENT_CLINIC_OR_DEPARTMENT_OTHER)
Admission: EM | Admit: 2021-10-22 | Discharge: 2021-10-22 | Disposition: A | Discharge status: Home / Self Care | Payer: BC Managed Care – PPO | Attending: Emergency Medicine | Admitting: Emergency Medicine

## 2021-10-22 ENCOUNTER — Encounter (HOSPITAL_BASED_OUTPATIENT_CLINIC_OR_DEPARTMENT_OTHER): Payer: Self-pay | Admitting: *Deleted

## 2021-10-22 DIAGNOSIS — G510 Bell's palsy: Secondary | ICD-10-CM | POA: Insufficient documentation

## 2021-10-22 DIAGNOSIS — Z91010 Allergy to peanuts: Secondary | ICD-10-CM | POA: Diagnosis not present

## 2021-10-22 DIAGNOSIS — R200 Anesthesia of skin: Secondary | ICD-10-CM | POA: Diagnosis present

## 2021-10-22 MED ORDER — PREDNISONE 50 MG PO TABS
60.0000 mg | ORAL_TABLET | Freq: Once | ORAL | Status: AC
  Administered 2021-10-22: 60 mg via ORAL
  Filled 2021-10-22: qty 1

## 2021-10-22 MED ORDER — PREDNISONE 20 MG PO TABS: ORAL_TABLET | ORAL | 0 refills | Status: DC

## 2021-10-22 NOTE — ED Provider Notes (Signed)
Note [facility] EMERGENCY DEPT Provider Note   CSN: [id number] Arrival date & time: 10/22/21  1551     History  Chief Complaint  Patient presents with   Numbness   Facial Pain    Daniel Russell is a 51 y.o. male.  HPI 51 year old male presents with concern for recurrent Bell's palsy.  He states that he photophobia in his left eye causing some tearing starting yesterday at work.  He has since developed pain and sharp sensation throughout his left face including his scalp.  Does not feel like he has developed a recurrent facial droop but the symptoms are similar to when he has had Bell's palsy in the past.  He had a couple times since his original episode a few years ago.  Always seems to be left-sided.  No headache or symptoms in his arms or legs.  Home Medications Prior to Admission medications   Medication Sig Start Date End Date Taking? Authorizing Provider  predniSONE (DELTASONE) 20 MG tablet 3 tabs po daily x 2 days, then 2 tabs x 3 days, then 1.5 tabs x 3 days, then 1 tab x 3 days, then 0.5 tabs x 3 days 10/23/21  Yes Sherwood Gambler, MD  albuterol (PROVENTIL HFA;VENTOLIN HFA) 108 (90 Base) MCG/ACT inhaler Inhale 1-2 puffs into the lungs every 6 (six) hours as needed for wheezing or shortness of breath. 12/27/17   Petrucelli, Samantha R, PA-C  atorvastatin (LIPITOR) 40 MG tablet Take 1 tablet (40 mg total) by mouth daily. 09/21/21   Adrian Prows, MD  buPROPion (WELLBUTRIN SR) 150 MG 12 hr tablet Take 1 tablet (150 mg total) by mouth 2 (two) times daily. 1 tab daily for 3 days then twice daily 09/21/21   Adrian Prows, MD  ibuprofen (ADVIL) 600 MG tablet Take 1 tablet (600 mg total) by mouth every 6 (six) hours as needed. 05/18/21   Sherrell Puller, PA-C  nicotine (NICODERM CQ) 7 mg/24hr patch Place 1 patch (7 mg total) onto the skin daily. 09/21/21   Adrian Prows, MD  nitroGLYCERIN (NITROSTAT) 0.4 MG SL tablet Place 1 tablet under the tongue as needed. 06/11/20   [provider]  pantoprazole (PROTONIX) 40 MG tablet Take 1 tablet (40 mg total) by mouth daily before breakfast. 09/21/21   Adrian Prows, MD      Allergies    Peanut-containing drug products, Pork allergy, and Cheese    Review of Systems   Review of Systems  Eyes:  Positive for visual disturbance.  Neurological:  Positive for numbness. Negative for weakness and headaches.    Physical Exam Updated Vital Signs BP 124/80 (BP Location: Right Arm)   Pulse 68   Temp 98.4 F (36.9 C)   Resp 16   SpO2 99%  Physical Exam Vitals and nursing note reviewed.  Constitutional:      Appearance: He is well-developed.  HENT:     Head: Normocephalic and atraumatic.  Eyes:     Extraocular Movements: Extraocular movements intact.     Pupils: Pupils are equal, round, and reactive to light.  Cardiovascular:     Rate and Rhythm: Normal rate and regular rhythm.     Heart sounds: Normal heart sounds.  Pulmonary:     Effort: Pulmonary effort is normal.     Breath sounds: Normal breath sounds.  Abdominal:     General: There is no distension.     Palpations: Abdomen is soft.     Tenderness: There is no abdominal tenderness.  Skin:    General: Skin is warm and dry.  Neurological:     Mental Status: He is alert.     Comments: There appears to be a slight facial droop when smiling on the left side and some slight forehead paresis.  Maybe a slight ptosis to the left eye.  Otherwise, 5/5 strength in all 4 extremities and normal finger-to-nose.  He reports decreased sensation to his left cheek.     ED Results / Procedures / Treatments   Labs (all labs ordered are listed, but only abnormal results are displayed) Labs Reviewed - No data to display  EKG None  Radiology No results found.  Procedures Procedures    Medications Ordered in ED Medications  predniSONE (DELTASONE) tablet 60 mg (has no administration in time range)    ED Course/ Medical Decision Making/ A&P                            Medical Decision Making Amount and/or Complexity of Data Reviewed External Data Reviewed: notes.  Risk Prescription drug management.   It does seem like he might have a slight facial palsy consistent with may be some mild Bell's palsy.  My suspicion for acute intracranial emergency such as stroke, head bleed, mass, etc. is pretty low.  Will start on steroids and have him follow-up with ENT given his recurrent symptoms.        Final Clinical Impression(s) / ED Diagnoses Final diagnoses:  Bell's palsy    Rx / DC Orders ED Discharge Orders          Ordered    predniSONE (DELTASONE) 20 MG tablet        10/22/21 2006              Sherwood Gambler, MD 10/22/21 2020

## 2021-10-22 NOTE — ED Triage Notes (Signed)
Pt is here for left sided facial numbness and pain in left side of face.  Pt was dx with Bels Palsy July 2020 and this has been intermittent but has been getting worse over the past few months.  Today around 10am it became worse and is associated with needle like pain in the left side of the face as well as eye drainage (tears).  No other focal weakness.  Pt has seen MD for this and given steroids

## 2022-07-12 ENCOUNTER — Ambulatory Visit: Payer: BC Managed Care – PPO | Admitting: Orthopaedic Surgery

## 2022-08-02 ENCOUNTER — Ambulatory Visit: Payer: BC Managed Care – PPO | Admitting: Orthopaedic Surgery

## 2022-08-13 ENCOUNTER — Ambulatory Visit: Payer: BC Managed Care – PPO | Admitting: Orthopedic Surgery

## 2022-08-27 ENCOUNTER — Ambulatory Visit: Payer: BC Managed Care – PPO | Admitting: Orthopedic Surgery

## 2022-09-15 ENCOUNTER — Ambulatory Visit: Payer: BC Managed Care – PPO | Admitting: Orthopedic Surgery

## 2023-03-21 IMAGING — DX DG LUMBAR SPINE COMPLETE 4+V
5 series · 5 of 5 positions shown · non-contrast
Comparison: April 26, 2020.

CLINICAL DATA: Acute lower back pain without known injury.

EXAM:
LUMBAR SPINE - COMPLETE 4+ VIEW

[l-spine ap]
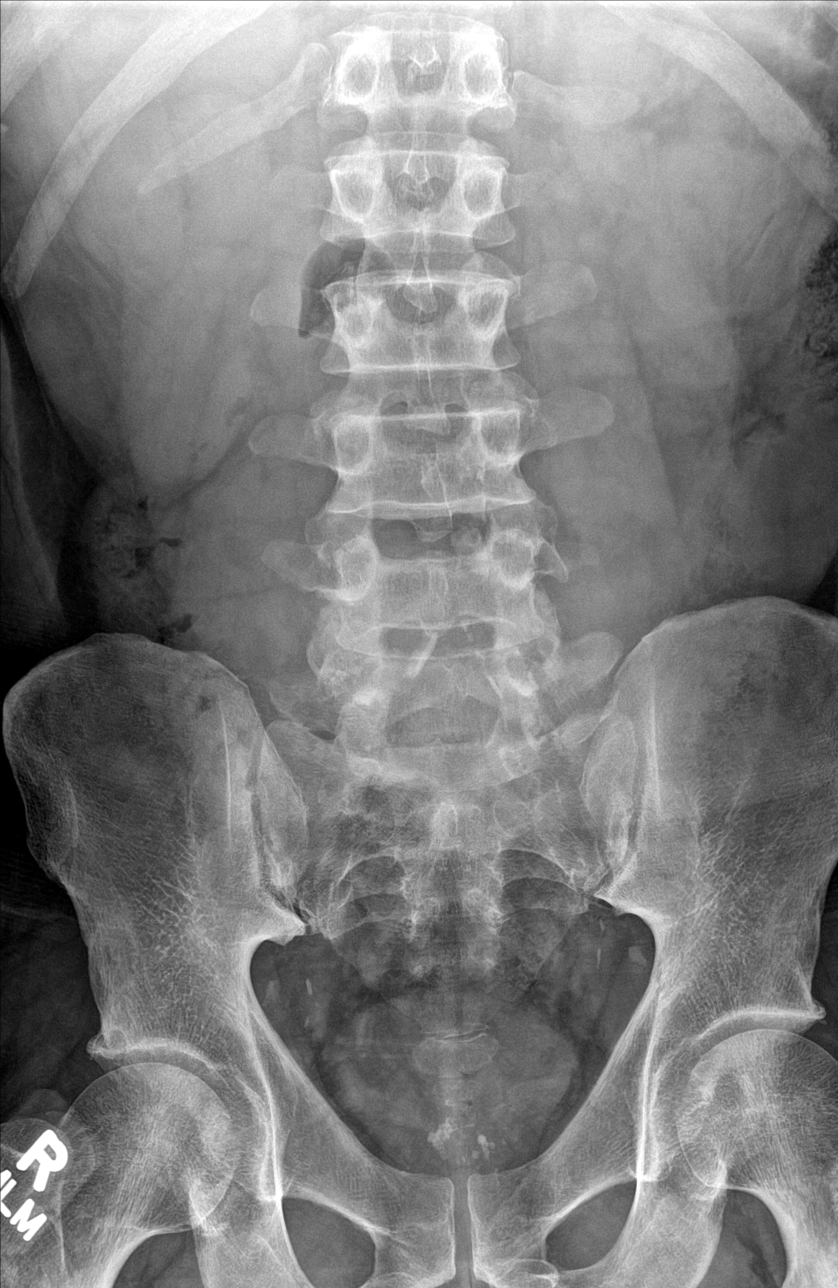

[l-spine obl (1 of 2)]
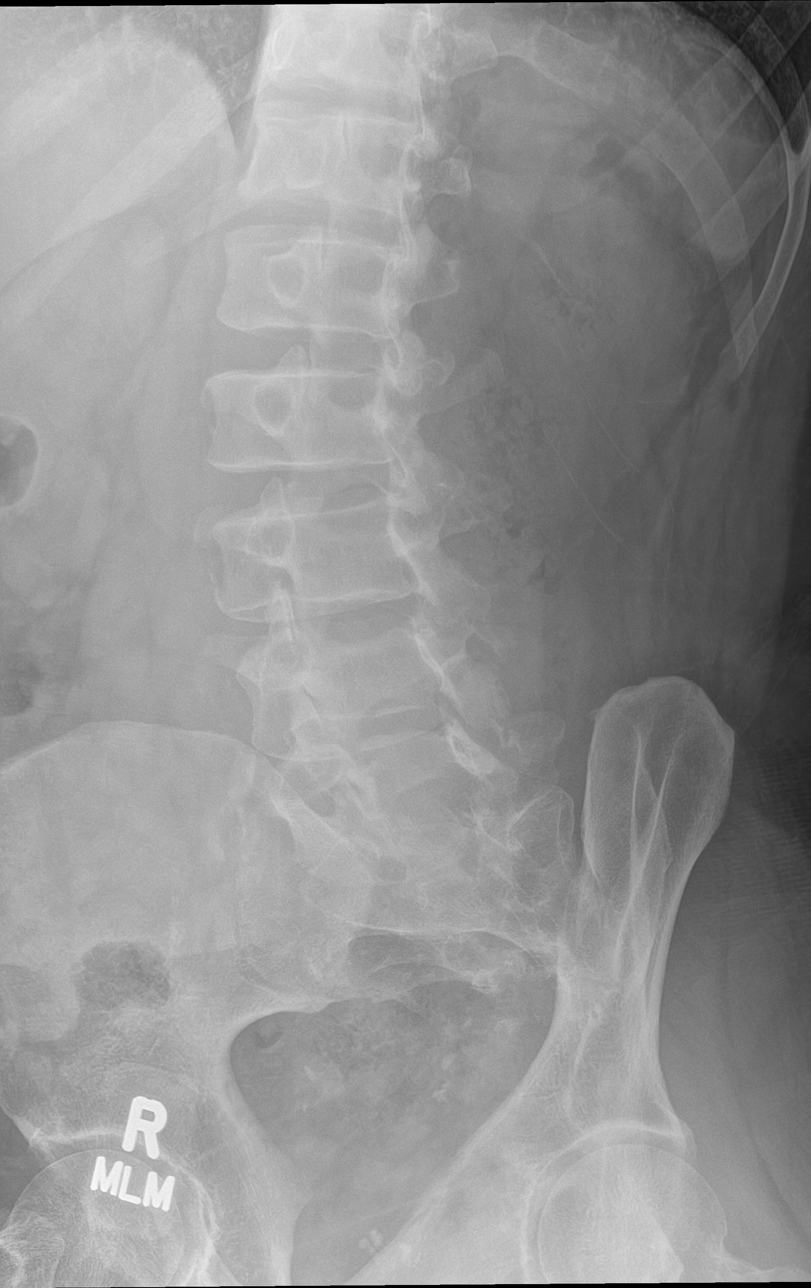

[l-spine obl (2 of 2)]
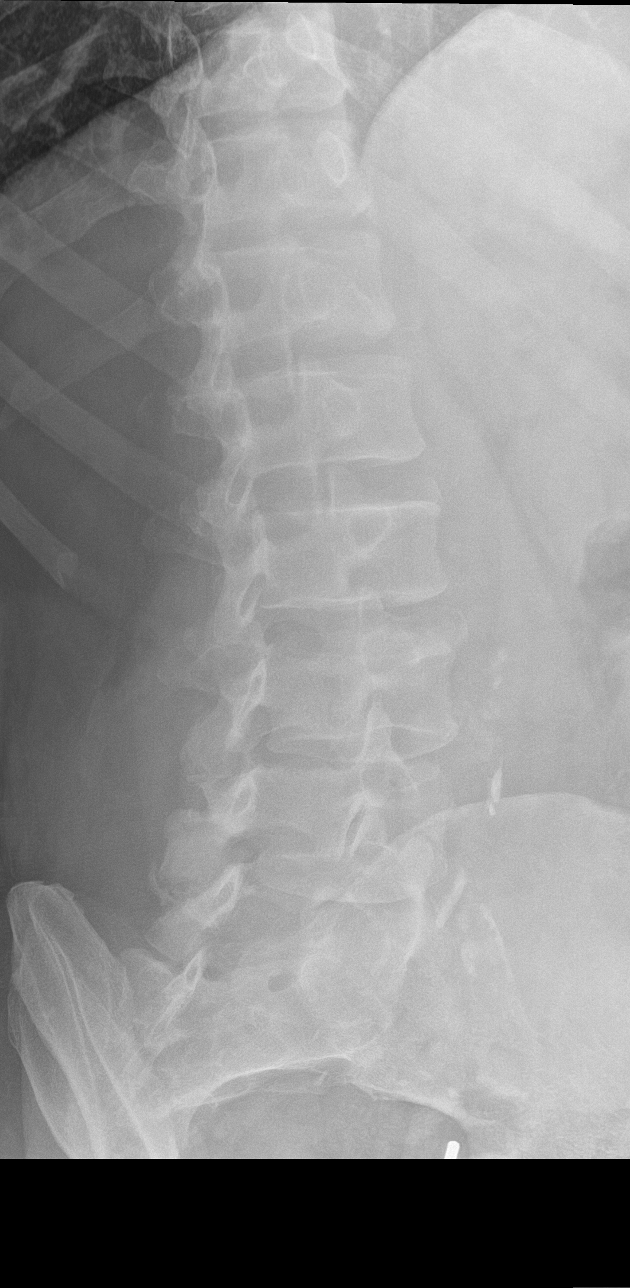

[l-spine lat]
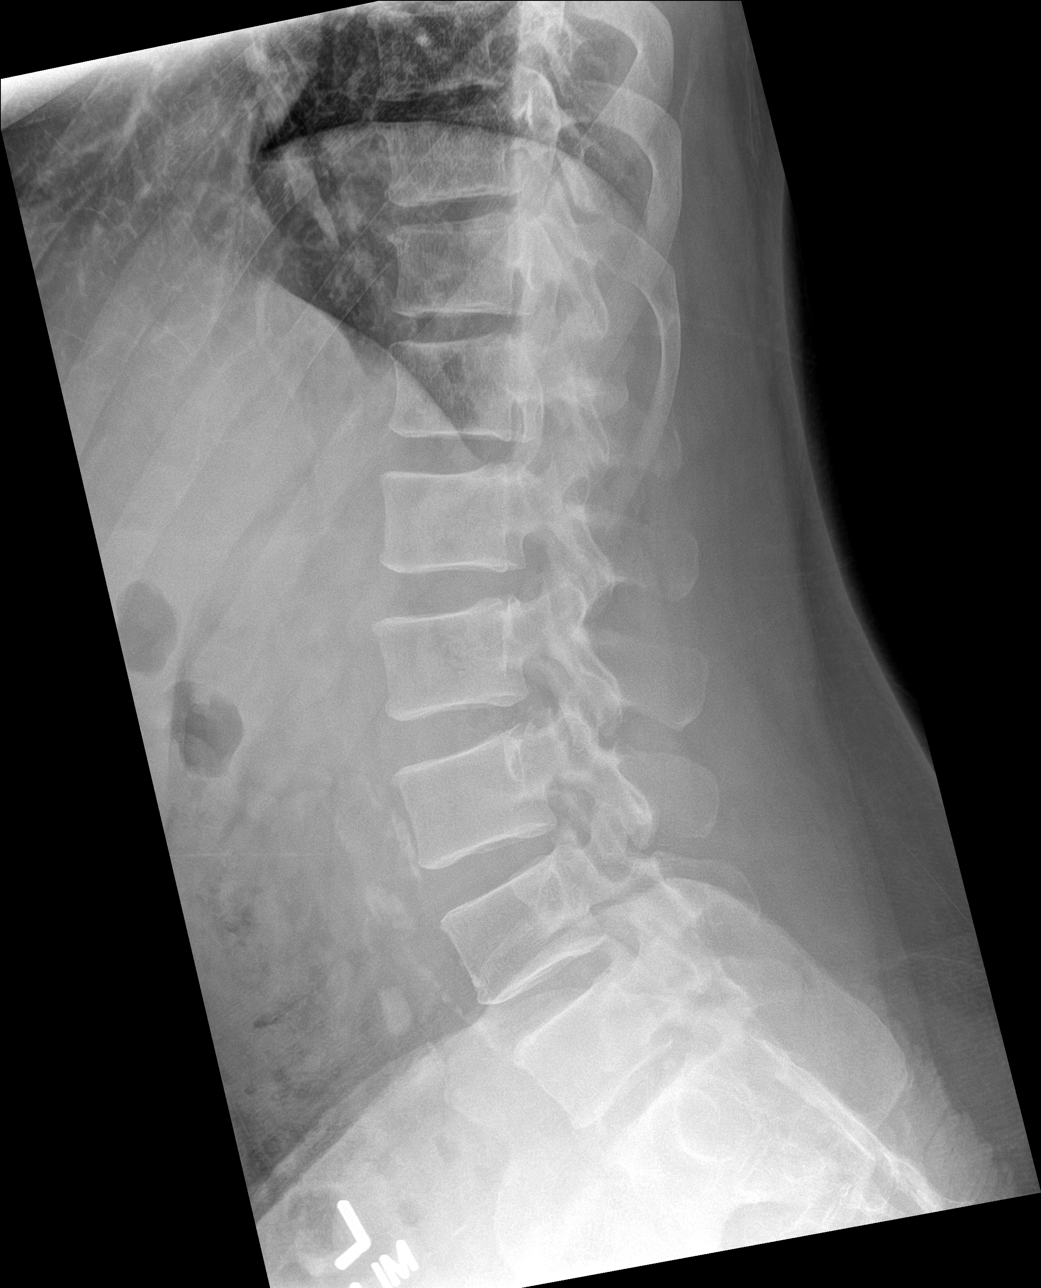

[l-spine spot]
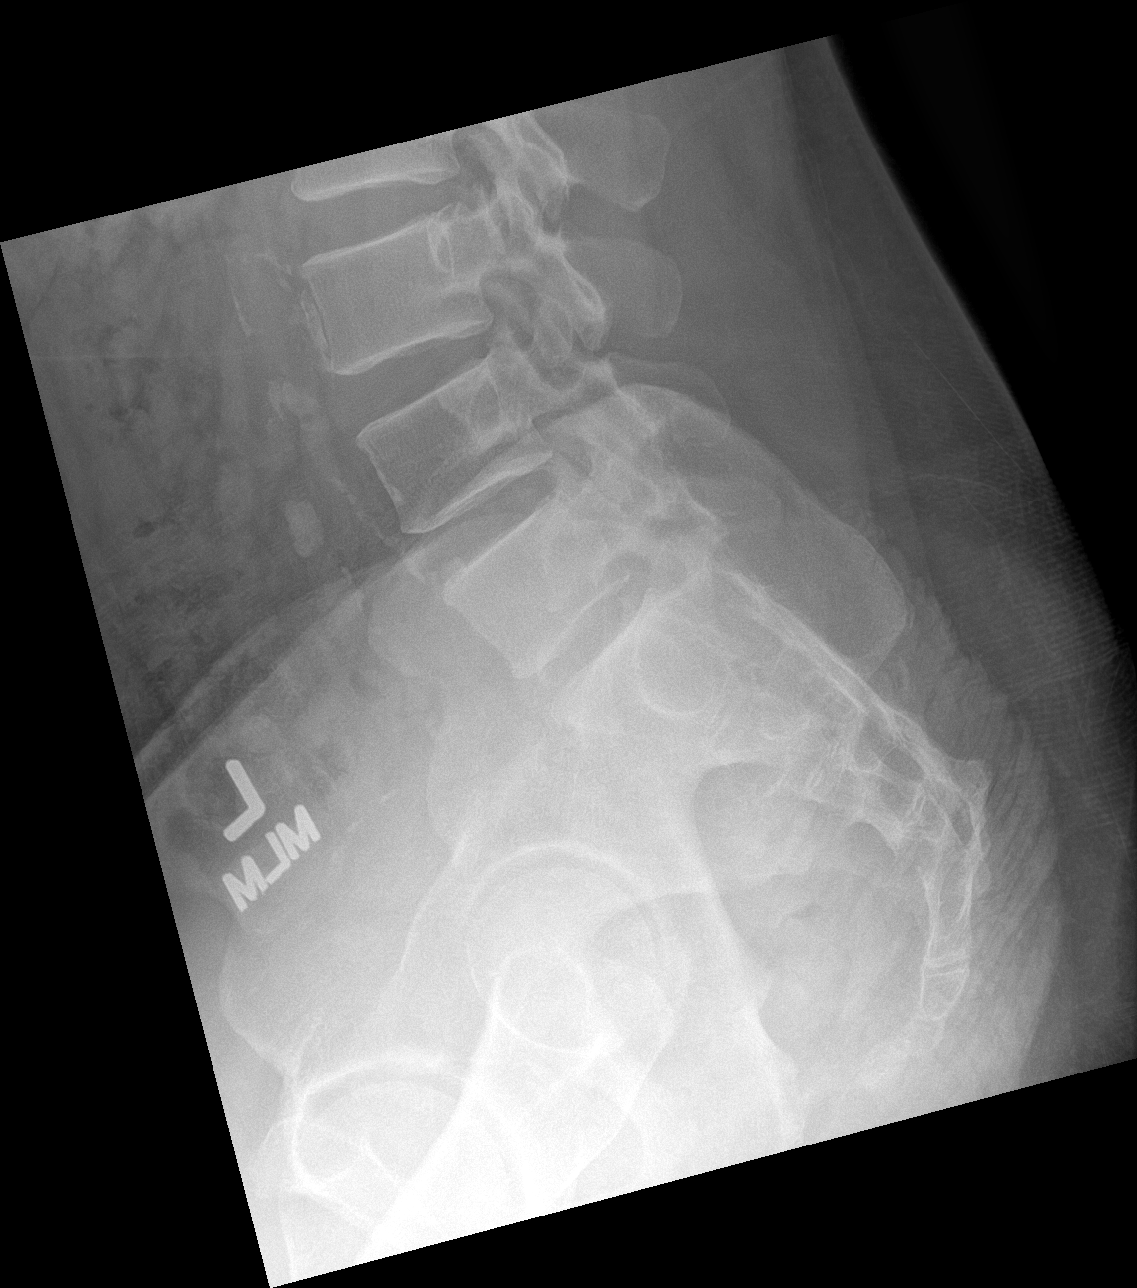

[5 of 5 positions shown; findings below may reference images not displayed]

FINDINGS: There is no evidence of lumbar spine fracture. Alignment is normal.
Intervertebral disc spaces are maintained. Mild degenerative disc
disease is seen involving the right-sided posterior facet joints of
L4-5 and L5-S1.
IMPRESSION: Mild degenerative changes seen involving several right-sided
posterior facet joints. No acute abnormality seen.

Aortic Atherosclerosis ([id number]).

## 2023-12-03 ENCOUNTER — Encounter (HOSPITAL_BASED_OUTPATIENT_CLINIC_OR_DEPARTMENT_OTHER): Payer: Self-pay

## 2023-12-03 ENCOUNTER — Emergency Department (HOSPITAL_BASED_OUTPATIENT_CLINIC_OR_DEPARTMENT_OTHER)

## 2023-12-03 ENCOUNTER — Emergency Department (HOSPITAL_BASED_OUTPATIENT_CLINIC_OR_DEPARTMENT_OTHER)
Admission: EM | Admit: 2023-12-03 | Discharge: 2023-12-03 | Disposition: A | Discharge status: Home / Self Care | Attending: Emergency Medicine | Admitting: Emergency Medicine

## 2023-12-03 ENCOUNTER — Other Ambulatory Visit: Payer: Self-pay

## 2023-12-03 DIAGNOSIS — Z91010 Allergy to peanuts: Secondary | ICD-10-CM | POA: Diagnosis not present

## 2023-12-03 DIAGNOSIS — M25562 Pain in left knee: Secondary | ICD-10-CM | POA: Diagnosis present

## 2023-12-03 MED ORDER — METHYLPREDNISOLONE SODIUM SUCC 125 MG IJ SOLR
125.0000 mg | Freq: Once | INTRAMUSCULAR | Status: AC
  Administered 2023-12-03: 125 mg via INTRAMUSCULAR
  Filled 2023-12-03: qty 2

## 2023-12-03 MED ORDER — PREDNISONE 20 MG PO TABS: ORAL_TABLET | ORAL | 0 refills | Status: AC

## 2023-12-03 MED ORDER — OMEPRAZOLE 20 MG PO CPDR: 20.0000 mg | DELAYED_RELEASE_CAPSULE | Freq: Every day | ORAL | 0 refills | Status: AC

## 2023-12-03 MED ORDER — OXYCODONE-ACETAMINOPHEN 5-325 MG PO TABS
2.0000 | ORAL_TABLET | Freq: Once | ORAL | Status: AC
  Administered 2023-12-03: 2 via ORAL
  Filled 2023-12-03: qty 2

## 2023-12-03 NOTE — ED Triage Notes (Signed)
 Pt reports left knee pain. Pt denies any trauma. Pt reports the problem started last month with no improvement.

## 2023-12-03 NOTE — ED Provider Notes (Signed)
  EMERGENCY DEPARTMENT AT [facility] Provider Note   CSN: [id number] Arrival date & time: 12/03/23  9471     Patient presents with: Knee Pain   Daniel Russell is a 53 y.o. male.  {Add pertinent medical, surgical, social history, OB history to HPI:32947} >1 month left knee pain and swelling that was atraumatic. No illnesses. No twisting. No heavy lifting. No feeling of cracking, catching when bending. Worse at night/AM, better with movement and certain positions.    Knee Pain      Prior to Admission medications   Medication Sig Start Date End Date Taking? Authorizing Provider  albuterol  (PROVENTIL  HFA;VENTOLIN  HFA) 108 (90 Base) MCG/ACT inhaler Inhale 1-2 puffs into the lungs every 6 (six) hours as needed for wheezing or shortness of breath. 12/27/17   Petrucelli, Samantha R, PA-C  atorvastatin  (LIPITOR) 40 MG tablet Take 1 tablet (40 mg total) by mouth daily. 09/21/21   Ladona Heinz, MD  buPROPion  (WELLBUTRIN  SR) 150 MG 12 hr tablet Take 1 tablet (150 mg total) by mouth 2 (two) times daily. 1 tab daily for 3 days then twice daily 09/21/21   Ganji, Jay, MD  ibuprofen  (ADVIL ) 600 MG tablet Take 1 tablet (600 mg total) by mouth every 6 (six) hours as needed. 05/18/21   Bernis Ernst, PA-C  nicotine  (NICODERM CQ ) 7 mg/24hr patch Place 1 patch (7 mg total) onto the skin daily. 09/21/21   Ladona Heinz, MD  nitroGLYCERIN (NITROSTAT) 0.4 MG SL tablet Place 1 tablet under the tongue as needed. 06/11/20   [provider]  pantoprazole  (PROTONIX ) 40 MG tablet Take 1 tablet (40 mg total) by mouth daily before breakfast. 09/21/21   Ladona Heinz, MD  predniSONE  (DELTASONE ) 20 MG tablet 3 tabs po daily x 2 days, then 2 tabs x 3 days, then 1.5 tabs x 3 days, then 1 tab x 3 days, then 0.5 tabs x 3 days 10/23/21   Freddi Hamilton, MD    Allergies: Peanut-containing drug products, Pork allergy, and Cheese    Review of Systems  Updated Vital Signs BP 122/85   Pulse 80   Temp 98 F  (36.7 C)   Resp 18   SpO2 98%   Physical Exam Vitals and nursing note reviewed.  Constitutional:      Appearance: He is well-developed.  HENT:     Head: Normocephalic and atraumatic.     Mouth/Throat:     Mouth: Mucous membranes are moist.  Cardiovascular:     Rate and Rhythm: Normal rate.  Pulmonary:     Effort: Pulmonary effort is normal. No respiratory distress.  Abdominal:     General: There is no distension.  Musculoskeletal:        General: Swelling present. Normal range of motion.     Cervical back: Normal range of motion.  Skin:    General: Skin is warm and dry.  Neurological:     General: No focal deficit present.     Mental Status: He is alert.     (all labs ordered are listed, but only abnormal results are displayed) Labs Reviewed - No data to display  EKG: None  Radiology: No results found.  {Document cardiac monitor, telemetry assessment procedure when appropriate:32947} Procedures   Medications Ordered in the ED  oxyCODONE -acetaminophen  (PERCOCET/ROXICET) 5-325 MG per tablet 2 tablet (has no administration in time range)  methylPREDNISolone  sodium succinate (SOLU-MEDROL ) 125 mg/2 mL injection 125 mg (has no administration in time range)      {Click  here for ABCD2, HEART and other calculators REFRESH Note before signing:1}                              Medical Decision Making Amount and/or Complexity of Data Reviewed Radiology: ordered.  Risk Prescription drug management.  Suspect arthritis. Most likely OA. No e/o septic arthritis, less likely gouty. No large effusion appreciated on exam to suggest need for arthrocentesis. H/o PUD will give prednisone  and PPI Rx if xr negative.    {Document critical care time when appropriate  Document review of labs and clinical decision tools ie CHADS2VASC2, etc  Document your independent review of radiology images and any outside records  Document your discussion with family members, caretakers and with  consultants  Document social determinants of health affecting pt's care  Document your decision making why or why not admission, treatments were needed:32947:::1}   Final diagnoses:  None    ED Discharge Orders     None
